# Patient Record
Sex: Female | Born: 1990 | Race: White | Hispanic: No | Marital: Married | Smoking: Former smoker
Health system: Southern US, Community
[De-identification: ages and names within clinical notes are randomized; demographics above are authoritative.]

## PROBLEM LIST (undated history)

## (undated) DIAGNOSIS — F419 Anxiety disorder, unspecified: Secondary | ICD-10-CM

## (undated) DIAGNOSIS — R000 Tachycardia, unspecified: Secondary | ICD-10-CM

## (undated) HISTORY — DX: Tachycardia, unspecified: R00.0

## (undated) HISTORY — DX: Anxiety disorder, unspecified: F41.9

---

## 2014-12-04 ENCOUNTER — Telehealth: Payer: Self-pay | Admitting: Family Medicine

## 2014-12-05 NOTE — Telephone Encounter (Signed)
[place] and would like to establish care. She needs a refill on her birth control and needs a pap smear. She has never had a pap smear. She has no medical conditions and doesn't take any other medications.  Appt scheduled for pap on 12/21/14. She will need to start a new pack of OCPs within the next week. She is taking Orsythia. Is it possible to call in one pack of OCPs to last until she is seen?

## 2014-12-05 NOTE — Telephone Encounter (Signed)
Yes. That will be ok  Cyniah Gossard A. Chauncey ReadingGann PA-C

## 2014-12-06 ENCOUNTER — Other Ambulatory Visit: Payer: Self-pay | Admitting: *Deleted

## 2014-12-06 MED ORDER — LEVONORGESTREL-ETHINYL ESTRAD 0.1-20 MG-MCG PO TABS: 1.0000 | ORAL_TABLET | Freq: Every day | ORAL | Status: DC

## 2014-12-06 NOTE — Telephone Encounter (Signed)
Refill of OCP sent in to [facility]

## 2014-12-06 NOTE — Telephone Encounter (Signed)
Script for [facility] sent to [facility] and message left for patient.

## 2014-12-21 ENCOUNTER — Encounter: Payer: Self-pay | Admitting: Physician Assistant

## 2014-12-31 ENCOUNTER — Other Ambulatory Visit: Payer: Self-pay | Admitting: Physician Assistant

## 2015-01-02 ENCOUNTER — Ambulatory Visit (INDEPENDENT_AMBULATORY_CARE_PROVIDER_SITE_OTHER): Payer: BLUE CROSS/BLUE SHIELD | Admitting: Physician Assistant

## 2015-01-02 ENCOUNTER — Encounter (INDEPENDENT_AMBULATORY_CARE_PROVIDER_SITE_OTHER): Payer: Self-pay

## 2015-01-02 ENCOUNTER — Encounter: Payer: Self-pay | Admitting: Physician Assistant

## 2015-01-02 VITALS — BP 112/77 | HR 104 | Temp 97.8°F | Ht 61.0 in | Wt 155.0 lb

## 2015-01-02 DIAGNOSIS — R5383 Other fatigue: Secondary | ICD-10-CM

## 2015-01-02 DIAGNOSIS — R635 Abnormal weight gain: Secondary | ICD-10-CM | POA: Diagnosis not present

## 2015-01-02 DIAGNOSIS — Z308 Encounter for other contraceptive management: Secondary | ICD-10-CM | POA: Diagnosis not present

## 2015-01-02 LAB — POCT CBC
Granulocyte percent: 70.4 %G (ref 37–80)
HCT, POC: 48.3 % — AB (ref 37.7–47.9)
HEMOGLOBIN: 15.3 g/dL (ref 12.2–16.2)
Lymph, poc: 2.2 (ref 0.6–3.4)
MCH, POC: 28.5 pg (ref 27–31.2)
MCHC: 31.6 g/dL — AB (ref 31.8–35.4)
MCV: 90.2 fL (ref 80–97)
MPV: 7.3 fL (ref 0–99.8)
POC Granulocyte: 6.8 (ref 2–6.9)
POC LYMPH %: 22.5 % (ref 10–50)
Platelet Count, POC: 249 10*3/uL (ref 142–424)
RBC: 5.36 M/uL (ref 4.04–5.48)
RDW, POC: 12.6 %
WBC: 9.6 10*3/uL (ref 4.6–10.2)

## 2015-01-02 MED ORDER — LEVONORGESTREL-ETHINYL ESTRAD 0.1-20 MG-MCG PO TABS: 1.0000 | ORAL_TABLET | Freq: Every day | ORAL | Status: DC

## 2015-01-02 NOTE — Progress Notes (Signed)
   Subjective:    Patient ID: Kelli Craig, female    DOB: Apr 14, 1991, 24 y.o.   MRN: [id number]  HPI 24 y/o female presents for establishment of care. She has been seeing Dr. Hyacinth Meeker at [facility] in [place]. She has no complaints today. She states that she has had problems with her weight over the past year. She exerciseds approximately 4 days a week, approximately 1 hour. She walks about a mile 2 times weekly. She recently cut out soft drinks and fast food, drinks water. Full time student at [employer]) and work full time at [employer])  She has not had a pap in the past but is having her menstrual cycle today so she will need to reschedule.     Review of Systems  Constitutional: Negative for activity change.       20-30 pound weight gain over past year  HENT: Negative.   Eyes: Negative.   Respiratory: Negative.   Cardiovascular: Negative.   Gastrointestinal: Negative.   Endocrine: Positive for polydipsia.  Genitourinary: Negative.   Neurological: Negative.   Psychiatric/Behavioral: Negative.   All other systems reviewed and are negative.      Objective:   Physical Exam  Constitutional: She is oriented to person, place, and time. She appears well-developed and well-nourished. No distress.  Neck: No thyromegaly present.  Cardiovascular: Regular rhythm and normal heart sounds.  Exam reveals no gallop and no friction rub.   No murmur heard. Mildly tachycardic   Pulmonary/Chest: Breath sounds normal. No respiratory distress. She has no wheezes. She has no rales. She exhibits no tenderness.  Musculoskeletal: Normal range of motion. She exhibits no edema.  Neurological: She is alert and oriented to person, place, and time.  Skin: She is not diaphoretic.  Psychiatric: She has a normal mood and affect. Her behavior is normal. Judgment and thought content normal.  Nursing note and vitals reviewed.         Assessment & Plan:  1. Encounter for other  contraceptive management  - levonorgestrel-ethinyl estradiol (AVIANE,ALESSE,LESSINA) 0.1-20 MG-MCG tablet; Take 1 tablet by mouth daily.  Dispense: 1 Package; Refill: 11  2. Other fatigue  - Thyroid Panel With TSH - POCT CBC  3. Weight gain  - Thyroid Panel With TSH - POCT CBC   Continue all meds Labs pending Health Maintenance reviewed Diet and exercise encouraged RTO 1 week for pap  Tiffany A. Chauncey Reading PA-C

## 2015-01-03 ENCOUNTER — Telehealth: Payer: Self-pay | Admitting: *Deleted

## 2015-01-03 LAB — THYROID PANEL WITH TSH
Free Thyroxine Index: 2.6 (ref 1.2–4.9)
T3 Uptake Ratio: 23 % — ABNORMAL LOW (ref 24–39)
T4 TOTAL: 11.1 ug/dL (ref 4.5–12.0)
TSH: 1.06 u[IU]/mL (ref 0.450–4.500)

## 2015-01-03 NOTE — Telephone Encounter (Signed)
Pt notified of lab results Verbalizes understanding 

## 2015-01-10 ENCOUNTER — Ambulatory Visit (INDEPENDENT_AMBULATORY_CARE_PROVIDER_SITE_OTHER): Payer: BLUE CROSS/BLUE SHIELD | Admitting: Physician Assistant

## 2015-01-10 ENCOUNTER — Encounter: Payer: Self-pay | Admitting: Physician Assistant

## 2015-01-10 VITALS — BP 126/74 | HR 110 | Ht 61.0 in | Wt 152.0 lb

## 2015-01-10 DIAGNOSIS — Z124 Encounter for screening for malignant neoplasm of cervix: Secondary | ICD-10-CM | POA: Diagnosis not present

## 2015-01-12 LAB — PAP IG, CT-NG, RFX HPV ASCU
Chlamydia, Nuc. Acid Amp: NEGATIVE
Gonococcus by Nucleic Acid Amp: NEGATIVE
PAP Smear Comment: 0

## 2015-01-13 NOTE — Progress Notes (Signed)
   Subjective:    Patient ID: Kelli Craig, female    DOB: [birth year], 24 y.o.   MRN: [id number]  HPI 24 y/o female presents for pap smear because she was unable to have it at her last appointment due to menstruation.    Review of Systems  Constitutional: Negative.   HENT: Negative.   Eyes: Negative.   Respiratory: Negative.   Cardiovascular: Negative.   Gastrointestinal: Negative.   Endocrine: Negative.   Genitourinary: Negative.   Neurological: Negative.   Psychiatric/Behavioral: Negative.        Objective:   Physical Exam  Genitourinary: There is no rash, tenderness or lesion on the right labia. There is no rash, tenderness or lesion on the left labia. There is bleeding (slight bleeding at cervix) in the vagina. No erythema or tenderness in the vagina. No vaginal discharge found.  Nursing note and vitals reviewed.         Assessment & Plan:  1. Pap smear for cervical cancer screening  - Pap IG, CT/NG w/ reflex HPV when ASC-U   Pelvic exam yearly, pap repeat in 3 years if normal.   Kazim Corrales A. Chauncey Reading PA-C

## 2015-10-22 ENCOUNTER — Other Ambulatory Visit: Payer: Self-pay | Admitting: Physician Assistant

## 2015-11-27 ENCOUNTER — Encounter: Payer: Self-pay | Admitting: *Deleted

## 2015-11-27 ENCOUNTER — Ambulatory Visit (INDEPENDENT_AMBULATORY_CARE_PROVIDER_SITE_OTHER): Payer: BLUE CROSS/BLUE SHIELD | Admitting: Family

## 2015-11-27 ENCOUNTER — Telehealth: Payer: Self-pay | Admitting: Family

## 2015-11-27 ENCOUNTER — Encounter: Payer: Self-pay | Admitting: Family

## 2015-11-27 ENCOUNTER — Encounter (INDEPENDENT_AMBULATORY_CARE_PROVIDER_SITE_OTHER): Payer: Self-pay

## 2015-11-27 VITALS — BP 121/78 | HR 106 | Temp 97.9°F | Ht 61.0 in | Wt 157.6 lb

## 2015-11-27 DIAGNOSIS — S39012A Strain of muscle, fascia and tendon of lower back, initial encounter: Secondary | ICD-10-CM

## 2015-11-27 MED ORDER — NAPROXEN 125 MG/5ML PO SUSP: 500.0000 mg | Freq: Two times a day (BID) | ORAL | Status: DC

## 2015-11-27 MED ORDER — CYCLOBENZAPRINE HCL 5 MG PO TABS: 5.0000 mg | ORAL_TABLET | Freq: Three times a day (TID) | ORAL | Status: DC | PRN

## 2015-11-27 MED ORDER — PREDNISONE 10 MG (21) PO TBPK: 10.0000 mg | ORAL_TABLET | Freq: Every day | ORAL | Status: DC

## 2015-11-27 MED ORDER — NAPROXEN 500 MG PO TABS: 500.0000 mg | ORAL_TABLET | Freq: Two times a day (BID) | ORAL | Status: DC

## 2015-11-27 NOTE — Telephone Encounter (Signed)
Prescription sent to pharmacy.

## 2015-11-27 NOTE — Patient Instructions (Signed)

## 2015-11-27 NOTE — Progress Notes (Signed)
   Subjective:    Patient ID: Kelli Craig, female    DOB: [birth year], 25 y.o.   MRN: [id number]  Back Pain This is a new problem. The current episode started in the past 7 days. The problem occurs constantly. The problem is unchanged. The pain is present in the lumbar spine. The quality of the pain is described as aching. The pain does not radiate. The pain is at a severity of 7/10. The pain is moderate. The symptoms are aggravated by bending and twisting. Pertinent negatives include no bladder incontinence, bowel incontinence, dysuria, fever, headaches, leg pain, numbness, tingling or weakness. She has tried NSAIDs for the symptoms. The treatment provided mild relief.      Review of Systems  Constitutional: Negative.  Negative for fever.  HENT: Negative.   Eyes: Negative.   Respiratory: Negative.  Negative for shortness of breath.   Cardiovascular: Negative.  Negative for palpitations.  Gastrointestinal: Negative.  Negative for bowel incontinence.  Endocrine: Negative.   Genitourinary: Negative.  Negative for bladder incontinence and dysuria.  Musculoskeletal: Positive for back pain.  Neurological: Negative.  Negative for tingling, weakness, numbness and headaches.  Hematological: Negative.   Psychiatric/Behavioral: Negative.   All other systems reviewed and are negative.      Objective:   Physical Exam  Constitutional: She is oriented to person, place, and time. She appears well-developed and well-nourished. No distress.  HENT:  Head: Normocephalic and atraumatic.  Eyes: Pupils are equal, round, and reactive to light.  Neck: Normal range of motion. Neck supple. No thyromegaly present.  Cardiovascular: Normal rate, regular rhythm, normal heart sounds and intact distal pulses.   No murmur heard. Pulmonary/Chest: Effort normal and breath sounds normal. No respiratory distress. She has no wheezes.  Abdominal: Soft. Bowel sounds are normal. She exhibits no distension. There is no  tenderness.  Musculoskeletal: Normal range of motion. She exhibits no edema or tenderness.  Neurological: She is alert and oriented to person, place, and time. She has normal reflexes. No cranial nerve deficit.  Skin: Skin is warm and dry.  Psychiatric: She has a normal mood and affect. Her behavior is normal. Judgment and thought content normal.  Vitals reviewed.   BP 121/78 mmHg  Pulse 106  Temp(Src) 97.9 F (36.6 C) (Oral)  Ht 5\' 1"  (1.549 m)  Wt 157 lb 9.6 oz (71.487 kg)  BMI 29.79 kg/m2       Assessment & Plan:  1. Low back strain, initial encounter -Rest -Ice and heat as needed -ROM exercises discussed -Naprosyn with food -Sedation precautions discussed -RTO prn  - naproxen (NAPROSYN) 125 MG/5ML suspension; Take 20 mLs (500 mg total) by mouth 2 (two) times daily with a meal.  Dispense: 473 mL; Refill: 3 - cyclobenzaprine (FLEXERIL) 5 MG tablet; Take 1 tablet (5 mg total) by mouth 3 (three) times daily as needed for muscle spasms.  Dispense: 30 tablet; Refill: 0 - predniSONE (STERAPRED UNI-PAK 21 TAB) 10 MG (21) TBPK tablet; Take 1 tablet (10 mg total) by mouth daily. As directed x 6 days  Dispense: 21 tablet; Refill: 0  Jannifer Rodneyhristy Aliyana Dlugosz, FNP

## 2016-01-14 ENCOUNTER — Other Ambulatory Visit: Payer: Self-pay | Admitting: Pediatrics

## 2016-02-14 ENCOUNTER — Other Ambulatory Visit: Payer: Self-pay | Admitting: Pediatrics

## 2016-03-15 ENCOUNTER — Other Ambulatory Visit: Payer: Self-pay | Admitting: Pediatrics

## 2016-04-21 ENCOUNTER — Other Ambulatory Visit: Payer: Self-pay | Admitting: *Deleted

## 2016-04-21 MED ORDER — LEVONORGESTREL-ETHINYL ESTRAD 0.1-20 MG-MCG PO TABS: 1.0000 | ORAL_TABLET | Freq: Every day | ORAL | 1 refills | Status: DC

## 2016-06-16 ENCOUNTER — Ambulatory Visit (INDEPENDENT_AMBULATORY_CARE_PROVIDER_SITE_OTHER): Payer: BLUE CROSS/BLUE SHIELD | Admitting: Family

## 2016-06-16 ENCOUNTER — Encounter: Payer: Self-pay | Admitting: Family

## 2016-06-16 VITALS — BP 122/77 | HR 94 | Temp 98.3°F | Ht 61.0 in | Wt 165.2 lb

## 2016-06-16 DIAGNOSIS — R5383 Other fatigue: Secondary | ICD-10-CM

## 2016-06-16 DIAGNOSIS — R635 Abnormal weight gain: Secondary | ICD-10-CM

## 2016-06-16 DIAGNOSIS — R42 Dizziness and giddiness: Secondary | ICD-10-CM | POA: Diagnosis not present

## 2016-06-16 NOTE — Patient Instructions (Signed)
Fatigue Introduction Fatigue is feeling tired all of the time, a lack of energy, or a lack of motivation. Occasional or mild fatigue is often a normal response to activity or life in general. However, long-lasting (chronic) or extreme fatigue may indicate an underlying medical condition. Follow these instructions at home: Watch your fatigue for any changes. The following actions may help to lessen any discomfort you are feeling:  Talk to your health care provider about how much sleep you need each night. Try to get the required amount every night.  Take medicines only as directed by your health care provider.  Eat a healthy and nutritious diet. Ask your health care provider if you need help changing your diet.  Drink enough fluid to keep your urine clear or pale yellow.  Practice ways of relaxing, such as yoga, meditation, massage therapy, or acupuncture.  Exercise regularly.  Change situations that cause you stress. Try to keep your work and personal routine reasonable.  Do not abuse illegal drugs.  Limit alcohol intake to no more than 1 drink per day for nonpregnant women and 2 drinks per day for men. One drink equals 12 ounces of beer, 5 ounces of wine, or 1 ounces of hard liquor.  Take a multivitamin, if directed by your health care provider. Contact a health care provider if:  Your fatigue does not get better.  You have a fever.  You have unintentional weight loss or gain.  You have headaches.  You have difficulty:  Falling asleep.  Sleeping throughout the night.  You feel angry, guilty, anxious, or sad.  You are unable to have a bowel movement (constipation).  You skin is dry.  Your legs or another part of your body is swollen. Get help right away if:  You feel confused.  Your vision is blurry.  You feel faint or pass out.  You have a severe headache.  You have severe abdominal, pelvic, or back pain.  You have chest pain, shortness of breath, or an  irregular or fast heartbeat.  You are unable to urinate or you urinate less than normal.  You develop abnormal bleeding, such as bleeding from the rectum, vagina, nose, lungs, or nipples.  You vomit blood.  You have thoughts about harming yourself or committing suicide.  You are worried that you might harm someone else. This information is not intended to replace advice given to you by your health care provider. Make sure you discuss any questions you have with your health care provider. Document Released: 05/04/2007 Document Revised: 12/13/2015 Document Reviewed: 11/08/2013  2017 Elsevier  

## 2016-06-16 NOTE — Progress Notes (Signed)
   Subjective:    Patient ID: Kelli Craig, female    DOB: [birth year], 25 y.o.   MRN: [id number]  HPI PT presents to the office today with fatigue, dizziness, and hard to lose weight over the  Last few months. Pt states she has had to pull off the road because she felt "weird and dizziness". PT states she is starting to a new job in January and is stressed, but does not believe this is the cause of her symptoms. PT last menses 06/15/16 and denies heavy bleeding. Pt states she has gained 30 lbs over the last few years. PT does state that her diet is not the best and does not exercise like she should but it is hard for her to lose the weight.    Review of Systems  Constitutional: Positive for fatigue.  Neurological: Positive for dizziness and light-headedness.  All other systems reviewed and are negative.      Objective:   Physical Exam  Constitutional: She is oriented to person, place, and time. She appears well-developed and well-nourished. No distress.  HENT:  Head: Normocephalic and atraumatic.  Eyes: Pupils are equal, round, and reactive to light.  Neck: Normal range of motion. Neck supple. No thyromegaly present.  Cardiovascular: Normal rate, regular rhythm, normal heart sounds and intact distal pulses.   No murmur heard. Pulmonary/Chest: Effort normal and breath sounds normal. No respiratory distress. She has no wheezes.  Abdominal: Soft. Bowel sounds are normal. She exhibits no distension. There is no tenderness.  Musculoskeletal: Normal range of motion. She exhibits no edema or tenderness.  Neurological: She is alert and oriented to person, place, and time.  Skin: Skin is warm and dry.  Psychiatric: She has a normal mood and affect. Her behavior is normal. Judgment and thought content normal.  Vitals reviewed.     BP 122/77 (BP Location: Left Arm, Patient Position: Sitting, Cuff Size: Normal)   Pulse 94   Temp 98.3 F (36.8 C) (Oral)   Ht '5\' 1"'$  (1.549 m)   Wt 165 lb 3.2  oz (74.9 kg)   LMP 06/12/2016 (Approximate)   BMI 31.21 kg/m      Assessment & Plan:  1. Other fatigue - CMP14+EGFR - Thyroid Panel With TSH - VITAMIN D 25 Hydroxy (Vit-D Deficiency, Fractures) - Anemia Profile B  2. Dizziness - CMP14+EGFR - Thyroid Panel With TSH - VITAMIN D 25 Hydroxy (Vit-D Deficiency, Fractures) - Anemia Profile B  3. Weight gain - CMP14+EGFR - Thyroid Panel With TSH - VITAMIN D 25 Hydroxy (Vit-D Deficiency, Fractures) - Anemia Profile B  Force fluids Rest Labs pending Encouraged healthy diet and exercise RTO Prn   Evelina Dun, FNP

## 2016-06-18 LAB — ANEMIA PROFILE B
BASOS ABS: 0 10*3/uL (ref 0.0–0.2)
Basos: 0 %
EOS (ABSOLUTE): 0.2 10*3/uL (ref 0.0–0.4)
Eos: 2 %
Ferritin: 130 ng/mL (ref 15–150)
Folate: >20 ng/mL (ref >3.0)
Hematocrit: 45.3 % (ref 34.0–46.6)
Hemoglobin: 15.2 g/dL (ref 11.1–15.9)
IMMATURE GRANS (ABS): 0 10*3/uL (ref 0.0–0.1)
IMMATURE GRANULOCYTES: 0 %
IRON: 73 ug/dL (ref 27–159)
Iron Saturation: 17 % (ref 15–55)
LYMPHS: 26 %
Lymphocytes Absolute: 2.9 10*3/uL (ref 0.7–3.1)
MCH: 30.6 pg (ref 26.6–33.0)
MCHC: 33.6 g/dL (ref 31.5–35.7)
MCV: 91 fL (ref 79–97)
MONOCYTES: 5 %
Monocytes Absolute: 0.6 10*3/uL (ref 0.1–0.9)
NEUTROS ABS: 7.4 10*3/uL — AB (ref 1.4–7.0)
Neutrophils: 67 %
PLATELETS: 255 10*3/uL (ref 150–379)
RBC: 4.97 x10E6/uL (ref 3.77–5.28)
RDW: 13.1 % (ref 12.3–15.4)
RETIC CT PCT: 0.8 % (ref 0.6–2.6)
TIBC: 425 ug/dL (ref 250–450)
UIBC: 352 ug/dL (ref 131–425)
Vitamin B-12: 390 pg/mL (ref 211–946)
WBC: 11.1 10*3/uL — ABNORMAL HIGH (ref 3.4–10.8)

## 2016-06-18 LAB — CMP14+EGFR
ALBUMIN: 4.5 g/dL (ref 3.5–5.5)
ALK PHOS: 89 IU/L (ref 39–117)
ALT: 20 IU/L (ref 0–32)
AST: 16 IU/L (ref 0–40)
Albumin/Globulin Ratio: 1.5 (ref 1.2–2.2)
BUN / CREAT RATIO: 11 (ref 9–23)
BUN: 8 mg/dL (ref 6–20)
Bilirubin Total: 0.3 mg/dL (ref 0.0–1.2)
CALCIUM: 9.2 mg/dL (ref 8.7–10.2)
CO2: 20 mmol/L (ref 18–29)
CREATININE: 0.75 mg/dL (ref 0.57–1.00)
Chloride: 101 mmol/L (ref 96–106)
GFR calc Af Amer: 128 mL/min/{1.73_m2} (ref >59)
GFR, EST NON AFRICAN AMERICAN: 111 mL/min/{1.73_m2} (ref >59)
GLOBULIN, TOTAL: 3 g/dL (ref 1.5–4.5)
GLUCOSE: 80 mg/dL (ref 65–99)
Potassium: 5.1 mmol/L (ref 3.5–5.2)
SODIUM: 141 mmol/L (ref 134–144)
Total Protein: 7.5 g/dL (ref 6.0–8.5)

## 2016-06-18 LAB — THYROID PANEL WITH TSH
FREE THYROXINE INDEX: 2.5 (ref 1.2–4.9)
T3 Uptake Ratio: 24 % (ref 24–39)
T4 TOTAL: 10.4 ug/dL (ref 4.5–12.0)
TSH: 1.53 u[IU]/mL (ref 0.450–4.500)

## 2016-06-18 LAB — VITAMIN D 25 HYDROXY (VIT D DEFICIENCY, FRACTURES): VIT D 25 HYDROXY: 35.7 ng/mL (ref 30.0–100.0)

## 2016-06-23 ENCOUNTER — Encounter: Payer: Self-pay | Admitting: Family

## 2016-07-08 ENCOUNTER — Encounter: Payer: Self-pay | Admitting: Family

## 2016-07-08 ENCOUNTER — Ambulatory Visit (INDEPENDENT_AMBULATORY_CARE_PROVIDER_SITE_OTHER): Payer: BLUE CROSS/BLUE SHIELD | Admitting: Family

## 2016-07-08 VITALS — BP 127/76 | HR 95 | Temp 98.2°F | Ht 61.0 in | Wt 166.8 lb

## 2016-07-08 DIAGNOSIS — F411 Generalized anxiety disorder: Secondary | ICD-10-CM | POA: Diagnosis not present

## 2016-07-08 LAB — CBC WITH DIFFERENTIAL/PLATELET
BASOS ABS: 0 10*3/uL (ref 0.0–0.2)
Basos: 0 %
EOS (ABSOLUTE): 0.2 10*3/uL (ref 0.0–0.4)
Eos: 3 %
Hematocrit: 44.4 % (ref 34.0–46.6)
Hemoglobin: 14.5 g/dL (ref 11.1–15.9)
Immature Grans (Abs): 0 10*3/uL (ref 0.0–0.1)
Immature Granulocytes: 0 %
LYMPHS ABS: 2.2 10*3/uL (ref 0.7–3.1)
Lymphs: 27 %
MCH: 30.3 pg (ref 26.6–33.0)
MCHC: 32.7 g/dL (ref 31.5–35.7)
MCV: 93 fL (ref 79–97)
MONOCYTES: 6 %
MONOS ABS: 0.5 10*3/uL (ref 0.1–0.9)
Neutrophils Absolute: 5.1 10*3/uL (ref 1.4–7.0)
Neutrophils: 64 %
PLATELETS: 251 10*3/uL (ref 150–379)
RBC: 4.79 x10E6/uL (ref 3.77–5.28)
RDW: 13.3 % (ref 12.3–15.4)
WBC: 8 10*3/uL (ref 3.4–10.8)

## 2016-07-08 MED ORDER — ESCITALOPRAM OXALATE 10 MG PO TABS: 10.0000 mg | ORAL_TABLET | Freq: Every day | ORAL | 3 refills | Status: DC

## 2016-07-08 NOTE — Patient Instructions (Signed)
Stress and Stress Management Stress is a normal reaction to life events. It is what you feel when life demands more than you are used to or more than you can handle. Some stress can be useful. For example, the stress reaction can help you catch the last bus of the day, study for a test, or meet a deadline at work. But stress that occurs too often or for too long can cause problems. It can affect your emotional health and interfere with relationships and normal daily activities. Too much stress can weaken your immune system and increase your risk for physical illness. If you already have a medical problem, stress can make it worse. What are the causes? All sorts of life events may cause stress. An event that causes stress for one person may not be stressful for another person. Major life events commonly cause stress. These may be positive or negative. Examples include losing your job, moving into a new home, getting married, having a baby, or losing a loved one. Less obvious life events may also cause stress, especially if they occur day after day or in combination. Examples include working long hours, driving in traffic, caring for children, being in debt, or being in a difficult relationship. What are the signs or symptoms? Stress may cause emotional symptoms including, the following:  Anxiety. This is feeling worried, afraid, on edge, overwhelmed, or out of control.  Anger. This is feeling irritated or impatient.  Depression. This is feeling sad, down, helpless, or guilty.  Difficulty focusing, remembering, or making decisions. Stress may cause physical symptoms, including the following:  Aches and pains. These may affect your head, neck, back, stomach, or other areas of your body.  Tight muscles or clenched jaw.  Low energy or trouble sleeping. Stress may cause unhealthy behaviors, including the following:  Eating to feel better (overeating) or skipping meals.  Sleeping too little, too  much, or both.  Working too much or putting off tasks (procrastination).  Smoking, drinking alcohol, or using drugs to feel better. How is this diagnosed? Stress is diagnosed through an assessment by your health care provider. Your health care provider will ask questions about your symptoms and any stressful life events.Your health care provider will also ask about your medical history and may order blood tests or other tests. Certain medical conditions and medicine can cause physical symptoms similar to stress. Mental illness can cause emotional symptoms and unhealthy behaviors similar to stress. Your health care provider may refer you to a mental health professional for further evaluation. How is this treated? Stress management is the recommended treatment for stress.The goals of stress management are reducing stressful life events and coping with stress in healthy ways. Techniques for reducing stressful life events include the following:  Stress identification. Self-monitor for stress and identify what causes stress for you. These skills may help you to avoid some stressful events.  Time management. Set your priorities, keep a calendar of events, and learn to say "no." These tools can help you avoid making too many commitments. Techniques for coping with stress include the following:  Rethinking the problem. Try to think realistically about stressful events rather than ignoring them or overreacting. Try to find the positives in a stressful situation rather than focusing on the negatives.  Exercise. Physical exercise can release both physical and emotional tension. The key is to find a form of exercise you enjoy and do it regularly.  Relaxation techniques. These relax the body and mind. Examples include yoga,  meditation, tai chi, biofeedback, deep breathing, progressive muscle relaxation, listening to music, being out in nature, journaling, and other hobbies. Again, the key is to find one or  more that you enjoy and can do regularly.  Healthy lifestyle. Eat a balanced diet, get plenty of sleep, and do not smoke. Avoid using alcohol or drugs to relax.  Strong support network. Spend time with family, friends, or other people you enjoy being around.Express your feelings and talk things over with someone you trust. Counseling or talktherapy with a mental health professional may be helpful if you are having difficulty managing stress on your own. Medicine is typically not recommended for the treatment of stress.Talk to your health care provider if you think you need medicine for symptoms of stress. Follow these instructions at home:  Keep all follow-up visits as directed by your health care provider.  Take all medicines as directed by your health care provider. Contact a health care provider if:  Your symptoms get worse or you start having new symptoms.  You feel overwhelmed by your problems and can no longer manage them on your own. Get help right away if:  You feel like hurting yourself or someone else. This information is not intended to replace advice given to you by your health care provider. Make sure you discuss any questions you have with your health care provider. Document Released: 12/31/2000 Document Revised: 12/13/2015 Document Reviewed: 03/01/2013 Elsevier Interactive Patient Education  2017 [employer].

## 2016-07-08 NOTE — Progress Notes (Signed)
   Subjective:    Patient ID: Kelli Craig, female    DOB: 25-Oct-1990, 25 y.o.   MRN: [id number]  HPI Pt presents to the office today with recurrent fatigue and dizziness. PT states this started two months ago and seems to have become worse. Pt was seen on 06/16/16 and had normal CMP, Thyroid panel, Anemia, and Vit D. Pt states these episodes have increased and feels this is related to stress. Pt is getting married in 3 months and has a very stressful job.    Review of Systems  Constitutional: Positive for fatigue.  Neurological: Positive for dizziness.  Psychiatric/Behavioral: Positive for decreased concentration.  All other systems reviewed and are negative.      Objective:   Physical Exam  Constitutional: She is oriented to person, place, and time. She appears well-developed and well-nourished. No distress.  HENT:  Head: Normocephalic.  Eyes: Pupils are equal, round, and reactive to light.  Neck: Normal range of motion. Neck supple. No thyromegaly present.  Cardiovascular: Normal rate, regular rhythm, normal heart sounds and intact distal pulses.   No murmur heard. Pulmonary/Chest: Effort normal and breath sounds normal. No respiratory distress. She has no wheezes.  Abdominal: Soft. Bowel sounds are normal. She exhibits no distension. There is no tenderness.  Musculoskeletal: Normal range of motion. She exhibits no edema or tenderness.  Neurological: She is alert and oriented to person, place, and time.  Skin: Skin is warm and dry.  Psychiatric: She has a normal mood and affect. Her behavior is normal. Judgment and thought content normal.  Vitals reviewed.   BP 127/76   Pulse 95   Temp 98.2 F (36.8 C) (Oral)   Ht 5\' 1"  (1.549 m)   Wt 166 lb 12.8 oz (75.7 kg)   LMP 06/12/2016 (Approximate)   BMI 31.52 kg/m      Assessment & Plan:  1. GAD (generalized anxiety disorder) -Pt started on Lexapro 10mg  today -Stress management  discussed -RTO in 5 weeks  - escitalopram  (LEXAPRO) 10 MG tablet; Take 1 tablet (10 mg total) by mouth daily.  Dispense: 90 tablet; Refill: 3 - CBC with Differential/Platelet  Jannifer Rodneyhristy Garhett Bernhard, FNP

## 2016-12-03 ENCOUNTER — Other Ambulatory Visit: Payer: Self-pay | Admitting: Family

## 2016-12-03 NOTE — Telephone Encounter (Signed)
Forwarding to Christy.

## 2017-05-28 ENCOUNTER — Other Ambulatory Visit: Payer: Self-pay | Admitting: Family

## 2017-05-28 NOTE — Telephone Encounter (Signed)
Last seen 07/08/16

## 2017-08-26 ENCOUNTER — Other Ambulatory Visit: Payer: Self-pay | Admitting: Family

## 2018-01-25 ENCOUNTER — Other Ambulatory Visit: Payer: Self-pay | Admitting: Physician Assistant

## 2018-01-25 DIAGNOSIS — R4789 Other speech disturbances: Secondary | ICD-10-CM

## 2018-01-25 DIAGNOSIS — R42 Dizziness and giddiness: Secondary | ICD-10-CM

## 2018-01-25 DIAGNOSIS — H539 Unspecified visual disturbance: Secondary | ICD-10-CM

## 2018-01-27 ENCOUNTER — Ambulatory Visit
Admission: RE | Admit: 2018-01-27 | Discharge: 2018-01-27 | Disposition: A | Discharge status: Home / Self Care | Payer: BLUE CROSS/BLUE SHIELD | Source: Ambulatory Visit | Attending: Physician Assistant | Admitting: Physician Assistant

## 2018-01-27 DIAGNOSIS — H539 Unspecified visual disturbance: Secondary | ICD-10-CM | POA: Insufficient documentation

## 2018-01-27 DIAGNOSIS — R4789 Other speech disturbances: Secondary | ICD-10-CM | POA: Diagnosis present

## 2018-01-27 DIAGNOSIS — R42 Dizziness and giddiness: Secondary | ICD-10-CM | POA: Diagnosis present

## 2019-06-24 ENCOUNTER — Other Ambulatory Visit: Payer: Self-pay

## 2019-06-24 ENCOUNTER — Emergency Department
Admission: EM | Admit: 2019-06-24 | Discharge: 2019-06-24 | Disposition: A | Discharge status: Home / Self Care | Payer: BC Managed Care – PPO | Attending: Emergency Medicine | Admitting: Emergency Medicine

## 2019-06-24 DIAGNOSIS — Z79899 Other long term (current) drug therapy: Secondary | ICD-10-CM | POA: Insufficient documentation

## 2019-06-24 DIAGNOSIS — R42 Dizziness and giddiness: Secondary | ICD-10-CM | POA: Insufficient documentation

## 2019-06-24 DIAGNOSIS — R079 Chest pain, unspecified: Secondary | ICD-10-CM | POA: Diagnosis not present

## 2019-06-24 DIAGNOSIS — F17200 Nicotine dependence, unspecified, uncomplicated: Secondary | ICD-10-CM | POA: Insufficient documentation

## 2019-06-24 DIAGNOSIS — R55 Syncope and collapse: Secondary | ICD-10-CM | POA: Diagnosis present

## 2019-06-24 LAB — CBC
HCT: 46.3 % — ABNORMAL HIGH (ref 36.0–46.0)
Hemoglobin: 15.6 g/dL — ABNORMAL HIGH (ref 12.0–15.0)
MCH: 29.8 pg (ref 26.0–34.0)
MCHC: 33.7 g/dL (ref 30.0–36.0)
MCV: 88.5 fL (ref 80.0–100.0)
Platelets: 250 10*3/uL (ref 150–400)
RBC: 5.23 MIL/uL — ABNORMAL HIGH (ref 3.87–5.11)
RDW: 13 % (ref 11.5–15.5)
WBC: 10.7 10*3/uL — ABNORMAL HIGH (ref 4.0–10.5)
nRBC: 0 % (ref 0.0–0.2)

## 2019-06-24 LAB — URINALYSIS, COMPLETE (UACMP) WITH MICROSCOPIC
Bilirubin Urine: NEGATIVE
Glucose, UA: NEGATIVE mg/dL
Hgb urine dipstick: NEGATIVE
Ketones, ur: NEGATIVE mg/dL
Leukocytes,Ua: NEGATIVE
Nitrite: NEGATIVE
Protein, ur: NEGATIVE mg/dL
Specific Gravity, Urine: 1.005 (ref 1.005–1.030)
pH: 6 (ref 5.0–8.0)

## 2019-06-24 LAB — BASIC METABOLIC PANEL
Anion gap: 11 (ref 5–15)
BUN: 15 mg/dL (ref 6–20)
CO2: 20 mmol/L — ABNORMAL LOW (ref 22–32)
Calcium: 9 mg/dL (ref 8.9–10.3)
Chloride: 108 mmol/L (ref 98–111)
Creatinine, Ser: 0.83 mg/dL (ref 0.44–1.00)
GFR calc Af Amer: >60 mL/min (ref >60)
GFR calc non Af Amer: >60 mL/min (ref >60)
Glucose, Bld: 113 mg/dL — ABNORMAL HIGH (ref 70–99)
Potassium: 3.3 mmol/L — ABNORMAL LOW (ref 3.5–5.1)
Sodium: 139 mmol/L (ref 135–145)

## 2019-06-24 LAB — PREGNANCY, URINE: Preg Test, Ur: NEGATIVE

## 2019-06-24 LAB — FIBRIN DERIVATIVES D-DIMER (ARMC ONLY): Fibrin derivatives D-dimer (ARMC): 215.6 ng/mL (FEU) (ref 0.00–499.00)

## 2019-06-24 MED ORDER — SODIUM CHLORIDE 0.9% FLUSH: 3.0000 mL | Freq: Once | INTRAVENOUS | Status: DC

## 2019-06-24 MED ORDER — LACTATED RINGERS IV BOLUS
1000.0000 mL | Freq: Once | INTRAVENOUS | Status: AC
  Administered 2019-06-24: 1000 mL via INTRAVENOUS

## 2019-06-24 NOTE — ED Notes (Signed)
See triage note  Presents s/p "syncopal episode" while work

## 2019-06-24 NOTE — ED Provider Notes (Signed)
[facility] Emergency Department Provider Note   ____________________________________________   First MD Initiated Contact with Patient 06/24/19 1115     (approximate)  I have reviewed the triage vital signs and the nursing notes.   HISTORY  Chief Complaint Loss of Consciousness    HPI Kelli Craig is a 28 y.o. female with no significant past medical history presents to the ED complaining of syncope.  Patient reports that she has dealt with intermittent lightheadedness for a long time, previously evaluated by neurology and told she might need tilt table testing.  She states that she had never actually passed out until today.  She states she was sitting in a chair at work when she began to feel lightheaded and lost consciousness for a few seconds.  She states her vision went blurry for a few seconds, but now she feels much better and no longer feels lightheaded.  She is concerned because she has been dealing with chest pain recently, which is new for her.  She describes it as a dull ache in the center of her chest present constantly for about the past 2 weeks.  She has not noticed any pain or swelling in her legs, denies any recent surgery or history of DVT/PE.  She does currently take OCPs.        History reviewed. No pertinent past medical history.  There are no active problems to display for this patient.   History reviewed. No pertinent surgical history.  Prior to Admission medications   Medication Sig Start Date End Date Taking? Authorizing Provider  escitalopram (LEXAPRO) 10 MG tablet Take 1 tablet (10 mg total) by mouth daily. 07/08/16   Junie Spencer, FNP  PRESCRIPTION MEDICATION Birth Control    [provider]  SRONYX 0.1-20 MG-MCG tablet TAKE 1 TABLET BY MOUTH DAILY. 05/28/17   Junie Spencer, FNP    Allergies Patient has no known allergies.  Family History  Problem Relation Age of Onset  . COPD Mother   . Cancer Maternal  Aunt 49  . Cancer Paternal Aunt 40  . Hearing loss Paternal Grandmother 22    Social History Social History   Tobacco Use  . Smoking status: Light Tobacco Smoker  . Smokeless tobacco: Never Used  . Tobacco comment: pt states she only smokes when she drinks alcohol  Substance Use Topics  . Alcohol use: Yes    Alcohol/week: 0.0 standard drinks    Comment: maybe once a month per pt  . Drug use: No    Review of Systems  Constitutional: No fever/chills Eyes: No visual changes. ENT: No sore throat. Cardiovascular: Positive for chest pain.  Positive for syncope. Respiratory: Denies shortness of breath. Gastrointestinal: No abdominal pain.  No nausea, no vomiting.  No diarrhea.  No constipation. Genitourinary: Negative for dysuria. Musculoskeletal: Negative for back pain. Skin: Negative for rash. Neurological: Negative for headaches, focal weakness or numbness.  ____________________________________________   PHYSICAL EXAM:  VITAL SIGNS: ED Triage Vitals  Enc Vitals Group     BP 06/24/19 0914 (!) 133/99     Pulse Rate 06/24/19 0914 (!) 110     Resp 06/24/19 0914 18     Temp 06/24/19 0914 99 F (37.2 C)     Temp Source 06/24/19 0914 Oral     SpO2 06/24/19 0914 100 %     Weight 06/24/19 0910 180 lb (81.6 kg)     Height 06/24/19 0910 5\' 1"  (1.549 m)  Head Circumference --      Peak Flow --      Pain Score 06/24/19 0910 0     Pain Loc --      Pain Edu? --      Excl. in [place]? --     Constitutional: Alert and oriented. Eyes: Conjunctivae are normal. Head: Atraumatic. Nose: No congestion/rhinnorhea. Mouth/Throat: Mucous membranes are moist. Neck: Normal ROM Cardiovascular: Tachycardic, regular rhythm. Grossly normal heart sounds. Respiratory: Normal respiratory effort.  No retractions. Lungs CTAB. Gastrointestinal: Soft and nontender. No distention. Genitourinary: deferred Musculoskeletal: No lower extremity tenderness nor edema. Neurologic:  Normal speech and  language. No gross focal neurologic deficits are appreciated. Skin:  Skin is warm, dry and intact. No rash noted. Psychiatric: Mood and affect are normal. Speech and behavior are normal.  ____________________________________________   LABS (all labs ordered are listed, but only abnormal results are displayed)  Labs Reviewed  BASIC METABOLIC PANEL - Abnormal; Notable for the following components:      Result Value   Potassium 3.3 (*)    CO2 20 (*)    Glucose, Bld 113 (*)    All other components within normal limits  CBC - Abnormal; Notable for the following components:   WBC 10.7 (*)    RBC 5.23 (*)    Hemoglobin 15.6 (*)    HCT 46.3 (*)    All other components within normal limits  URINALYSIS, COMPLETE (UACMP) WITH MICROSCOPIC - Abnormal; Notable for the following components:   Color, Urine STRAW (*)    APPearance CLEAR (*)    Bacteria, UA MANY (*)    All other components within normal limits  PREGNANCY, URINE  FIBRIN DERIVATIVES D-DIMER ([facility] ONLY)  POC URINE PREG, ED  CBG MONITORING, ED   ____________________________________________  EKG  ED ECG REPORT I, Blake Divine, the attending physician, personally viewed and interpreted this ECG.   Date: 06/24/2019  EKG Time: 9:27  Rate: 106  Rhythm: sinus tachycardia  Axis: Normal  Intervals:none  ST&T Change: None   PROCEDURES  Procedure(s) performed (including Critical Care):  Procedures   ____________________________________________   INITIAL IMPRESSION / ASSESSMENT AND PLAN / ED COURSE       28 year old female with history of recurrent dizziness presents to the ED complaining of episode of syncope earlier today as well as constant pain in her chest for the past 2 weeks.  EKG shows no evidence of arrhythmia or ischemia, does show sinus tachycardia.  Given tachycardia along with chest pain, will need to assess for PE and unable to rule this out via [facility] criteria as patient takes OCPs.  She is low risk by  [place], will check D-dimer and if this is negative, doubt PE.  Pregnancy testing is pending, however UA is unremarkable.  No evidence of infectious process.  Pregnancy testing is negative, D-dimer within normal limits.  Doubt PE and patient remains asymptomatic at this time.  Counseled patient to establish care with PCP and to return to the ED for new or worsening symptoms.  Patient agrees with plan.      ____________________________________________   FINAL CLINICAL IMPRESSION(S) / ED DIAGNOSES  Final diagnoses:  Syncope, unspecified syncope type  Lightheadedness  Nonspecific chest pain     ED Discharge Orders    None       Note:  This document was prepared using Dragon voice recognition software and may include unintentional dictation errors.   Blake Divine, MD 06/24/19 1534

## 2019-06-24 NOTE — ED Triage Notes (Signed)
Pt states she passed out while sitting at work, states she has been having some chest pain this week. States she is very stressed at work. Pt is a/ox4 on arrival. In NAD

## 2020-07-16 ENCOUNTER — Emergency Department (HOSPITAL_COMMUNITY): Payer: No Typology Code available for payment source

## 2020-07-16 ENCOUNTER — Encounter (HOSPITAL_COMMUNITY): Payer: Self-pay

## 2020-07-16 ENCOUNTER — Emergency Department (HOSPITAL_COMMUNITY)
Admission: EM | Admit: 2020-07-16 | Discharge: 2020-07-16 | Disposition: A | Discharge status: Home / Self Care | Payer: No Typology Code available for payment source | Attending: Emergency Medicine | Admitting: Emergency Medicine

## 2020-07-16 ENCOUNTER — Other Ambulatory Visit: Payer: Self-pay

## 2020-07-16 DIAGNOSIS — F17200 Nicotine dependence, unspecified, uncomplicated: Secondary | ICD-10-CM | POA: Insufficient documentation

## 2020-07-16 DIAGNOSIS — Z3A11 11 weeks gestation of pregnancy: Secondary | ICD-10-CM | POA: Insufficient documentation

## 2020-07-16 DIAGNOSIS — H538 Other visual disturbances: Secondary | ICD-10-CM

## 2020-07-16 DIAGNOSIS — O26892 Other specified pregnancy related conditions, second trimester: Secondary | ICD-10-CM | POA: Insufficient documentation

## 2020-07-16 DIAGNOSIS — O99891 Other specified diseases and conditions complicating pregnancy: Secondary | ICD-10-CM | POA: Diagnosis not present

## 2020-07-16 DIAGNOSIS — R000 Tachycardia, unspecified: Secondary | ICD-10-CM | POA: Diagnosis not present

## 2020-07-16 LAB — CBC
HCT: 42.7 % (ref 36.0–46.0)
Hemoglobin: 13.9 g/dL (ref 12.0–15.0)
MCH: 29.4 pg (ref 26.0–34.0)
MCHC: 32.6 g/dL (ref 30.0–36.0)
MCV: 90.5 fL (ref 80.0–100.0)
Platelets: 235 10*3/uL (ref 150–400)
RBC: 4.72 MIL/uL (ref 3.87–5.11)
RDW: 12.1 % (ref 11.5–15.5)
WBC: 13 10*3/uL — ABNORMAL HIGH (ref 4.0–10.5)
nRBC: 0 % (ref 0.0–0.2)

## 2020-07-16 LAB — COMPREHENSIVE METABOLIC PANEL
ALT: 17 U/L (ref 0–44)
AST: 17 U/L (ref 15–41)
Albumin: 3.2 g/dL — ABNORMAL LOW (ref 3.5–5.0)
Alkaline Phosphatase: 73 U/L (ref 38–126)
Anion gap: 10 (ref 5–15)
BUN: 6 mg/dL (ref 6–20)
CO2: 20 mmol/L — ABNORMAL LOW (ref 22–32)
Calcium: 9.3 mg/dL (ref 8.9–10.3)
Chloride: 107 mmol/L (ref 98–111)
Creatinine, Ser: 0.61 mg/dL (ref 0.44–1.00)
GFR, Estimated: >60 mL/min (ref >60)
Glucose, Bld: 115 mg/dL — ABNORMAL HIGH (ref 70–99)
Potassium: 3.5 mmol/L (ref 3.5–5.1)
Sodium: 137 mmol/L (ref 135–145)
Total Bilirubin: 0.4 mg/dL (ref 0.3–1.2)
Total Protein: 7.1 g/dL (ref 6.5–8.1)

## 2020-07-16 LAB — TROPONIN I (HIGH SENSITIVITY)
Troponin I (High Sensitivity): 3 ng/L (ref <18)
Troponin I (High Sensitivity): 4 ng/L (ref <18)

## 2020-07-16 LAB — I-STAT BETA HCG BLOOD, ED (MC, WL, AP ONLY): I-stat hCG, quantitative: >2000 m[IU]/mL — ABNORMAL HIGH (ref <5)

## 2020-07-16 LAB — CBG MONITORING, ED: Glucose-Capillary: 134 mg/dL — ABNORMAL HIGH (ref 70–99)

## 2020-07-16 MED ORDER — LORAZEPAM 2 MG/ML IJ SOLN
1.0000 mg | Freq: Once | INTRAMUSCULAR | Status: AC
  Administered 2020-07-16: 21:00:00 1 mg via INTRAMUSCULAR
  Filled 2020-07-16: qty 1

## 2020-07-16 NOTE — Discharge Instructions (Addendum)
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 Please follow-up with your primary care doctor, I also want you to follow-up with outpatient neurology. If you have any new or worsening concerning symptoms please come back to the emergency department. Keep an eye on your blood pressure. I also want you to keep a diary as we discussed. Use the attached instructions. Follow-up with your OB. I also want you to follow-up with ophthalmology, Dr. Vonna Kotyk. Try make an appointment.  Get help right away if: You have: Severe eye pain. A severe headache. A sudden change in vision. A sudden loss of vision. A vision change after an injury. You notice flashing lights in your field of vision. Your field of vision is the area that you can see without moving your eyes.

## 2020-07-16 NOTE — ED Triage Notes (Signed)
Pt reports she is here today due to blurred vision at 12:30. Pt reports she lost complete vision in her left eye.Pt reports she now has since gain vision in back. 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 Pt reports she is always lightheaded and dizzy.Pt is also 11 weeks preg.

## 2020-07-16 NOTE — ED Notes (Addendum)
md at bedside, pt aao4, gcs15, nadn, vss on ccm. Family at bedside, side rails up. Call bell in reach

## 2020-07-16 NOTE — ED Notes (Signed)
Pa  at bedside. 

## 2020-07-16 NOTE — ED Provider Notes (Signed)
Medical screening examination/treatment/procedure(s) were conducted as a shared visit with non-physician practitioner(s) and myself.  I personally evaluated the patient during the encounter.  EKG Interpretation  Date/Time:  Monday July 16 2020 14:13:15 EST Ventricular Rate:  120 PR Interval:  146 QRS Duration: 74 QT Interval:  312 QTC Calculation: 440 R Axis:   82 Text Interpretation: Sinus tachycardia T wave abnormality, consider inferior ischemia Abnormal ECG no sig change from previous Confirmed by Arby Barrette [phone]) on 07/16/2020 11:00:35 PM  Patient was at work.  She had a sudden onset of first a wavy's blurred condition in the peripheral vision on the left eye.  This progressed to have loss of vision in the peripheral field.  Patient described if she brought her hand around in front of her face she could see it or almost see through it but to the periphery, there was no vision.  This resolved after about 30 minutes.  Patient reports after it occurred she did have a headache.  She does have history of vertigo this been longstanding.  She does get episodic headaches.  There were no other neurologic symptoms associated.  She has not been sick with fever chills or neck stiffness.  Patient is [redacted] weeks gestation.  Patient is alert and appropriate.  Mental status is clear.  Pupils are symmetric 3 mm and responsive.  Fundus exam on the left shows normal-appearing fundus with no areas suggestive of retinal detachment.  No papilledema.  Cranial nerves II through XII intact.  Normal finger-nose exam.  No motor deficits.  Speech and cognitive function normal.  Neurology has been consulted.  We will proceed with MRIs as suggested, I agree with plan of management.   Arby Barrette, MD 07/16/20 2303

## 2020-07-16 NOTE — ED Provider Notes (Signed)
[facility]   CSN: [id number] Arrival date & time: 07/16/20  1330     History Chief Complaint  Patient presents with  . Blurred Vision    Kelli Craig is a 29 y.o. female G1, P0 with pertinent past medical history for longstanding history of intermittent dizziness followed by neurology that presents to the emergency department today for blurred vision.  Patient states that around 1230 she started having some blurry vision on her left eye, progressed to complete vision loss in her periphery of her left eye.  States that the medial aspect of her left eye did not have complete vision loss, however did have blurry vision on that side.  No changes to right eye.  Denies any eye pain.  Denied any headache.  Denied any aura.  States that she is never had anything like this before.   Patient is currently [redacted] weeks pregnant.  States that while she has been waiting here she did start developing a headache in the frontal area of her head.  Was not there when she started having her vision changes.  Patient states that she is normally tachycardic and dizzy.  States that she always feels nauseous due to her pregnancy.  No new nausea.  No vomiting.  No fevers or chills.  No numbness or tingling.  No gait abnormality, syncope, weakness, aphasia, confusion, neglect.  Denies any other complaints.  Per chart review patient follows [facility] for her dizziness, did have a CT head in 2018 which was normal, patient recently had a normal tilt table test in 2021.  Has had multiple episodes of syncope with negative work-ups.  Did also have a Holter monitor performed in January 2021 which revealed prominently normal sinus rhythm.  Marland Kitchen.  HPI     History reviewed. No pertinent past medical history.  There are no problems to display for this patient.   History reviewed. No pertinent surgical history.   OB History    Gravida  1   Para      Term      Preterm       AB      Living        SAB      IAB      Ectopic      Multiple      Live Births              Family History  Problem Relation Age of Onset  . COPD Mother   . Cancer Maternal Aunt 49  . Cancer Paternal Aunt [id number]  . Hearing loss Paternal Grandmother [phone]    Social History   Tobacco Use  . Smoking status: Light Tobacco Smoker  . Smokeless tobacco: Never Used  . Tobacco comment: pt states she only smokes when she drinks alcohol  Substance Use Topics  . Alcohol use: Yes    Alcohol/week: 0.0 standard drinks    Comment: maybe once a month per pt  . Drug use: No    Home Medications Prior to Admission medications   Medication Sig Start Date End Date Taking? Authorizing Provider  escitalopram (LEXAPRO) 10 MG tablet Take 1 tablet (10 mg total) by mouth daily. 07/08/16   Junie SpencerHawks, Christy A, FNP  PRESCRIPTION MEDICATION Birth Control    [provider]  SRONYX 0.1-20 MG-MCG tablet TAKE 1 TABLET BY MOUTH DAILY. 05/28/17   Junie SpencerHawks, Christy A, FNP    Allergies    Patient has no known  allergies.  Review of Systems   Review of Systems  Constitutional: Negative for chills, diaphoresis, fatigue and fever.  HENT: Negative for congestion, sore throat and trouble swallowing.   Eyes: Positive for visual disturbance. Negative for pain.  Respiratory: Negative for cough, shortness of breath and wheezing.   Cardiovascular: Negative for chest pain, palpitations and leg swelling.  Gastrointestinal: Negative for abdominal distention, abdominal pain, diarrhea, nausea and vomiting.  Genitourinary: Negative for difficulty urinating.  Musculoskeletal: Negative for back pain, neck pain and neck stiffness.  Skin: Negative for pallor.  Neurological: Negative for dizziness, speech difficulty, weakness and headaches.  Psychiatric/Behavioral: Negative for confusion.    Physical Exam Updated Vital Signs BP 131/87   Pulse (!) 115   Temp 98.5 F (36.9 C) (Oral)   Resp 18   LMP 05/09/2020    SpO2 100%   Physical Exam Constitutional:      General: She is not in acute distress.    Appearance: Normal appearance. She is not ill-appearing, toxic-appearing or diaphoretic.  HENT:     Mouth/Throat:     Mouth: Mucous membranes are moist.     Pharynx: Oropharynx is clear.  Eyes:     General: Lids are normal. Vision grossly intact. No scleral icterus.    Extraocular Movements: Extraocular movements intact.     Right eye: Normal extraocular motion and no nystagmus.     Left eye: Normal extraocular motion and no nystagmus.     Conjunctiva/sclera:     Right eye: Right conjunctiva is not injected. No chemosis, exudate or hemorrhage.    Left eye: Left conjunctiva is not injected. No chemosis, exudate or hemorrhage.    Pupils: Pupils are equal, round, and reactive to light.     Right eye: Pupil is round, reactive and not sluggish.     Left eye: Pupil is round, reactive and not sluggish.     Funduscopic exam:    Right eye: No hemorrhage, exudate or papilledema.        Left eye: No hemorrhage, exudate or papilledema.     Comments: PERRLA, normal nonpainful EOMs.  No vision changes in all visual fields.  Cardiovascular:     Rate and Rhythm: Normal rate and regular rhythm.     Pulses: Normal pulses.     Heart sounds: Normal heart sounds.  Pulmonary:     Effort: Pulmonary effort is normal. No respiratory distress.     Breath sounds: Normal breath sounds. No stridor. No wheezing, rhonchi or rales.  Chest:     Chest wall: No tenderness.  Abdominal:     General: Abdomen is flat. There is no distension.     Palpations: Abdomen is soft.     Tenderness: There is no abdominal tenderness. There is no guarding or rebound.  Musculoskeletal:        General: No swelling or tenderness. Normal range of motion.     Cervical back: Normal range of motion and neck supple. No rigidity.     Right lower leg: No edema.     Left lower leg: No edema.  Skin:    General: Skin is warm and dry.      Capillary Refill: Capillary refill takes less than 2 seconds.     Coloration: Skin is not pale.  Neurological:     General: No focal deficit present.     Mental Status: She is alert and oriented to person, place, and time.  Psychiatric:        Mood and Affect:  Mood normal.        Behavior: Behavior normal.     ED Results / Procedures / Treatments   Labs (all labs ordered are listed, but only abnormal results are displayed) Labs Reviewed  CBC - Abnormal; Notable for the following components:      Result Value   WBC 13.0 (*)    All other components within normal limits  COMPREHENSIVE METABOLIC PANEL - Abnormal; Notable for the following components:   CO2 20 (*)    Glucose, Bld 115 (*)    Albumin 3.2 (*)    All other components within normal limits  CBG MONITORING, ED - Abnormal; Notable for the following components:   Glucose-Capillary 134 (*)    All other components within normal limits  I-STAT BETA HCG BLOOD, ED ([facility], [facility], [facility] ONLY) - Abnormal; Notable for the following components:   I-stat hCG, quantitative >2,000.0 (*)    All other components within normal limits  CBG MONITORING, ED  TROPONIN I (HIGH SENSITIVITY)  TROPONIN I (HIGH SENSITIVITY)    EKG None  Radiology MR BRAIN WO CONTRAST  Result Date: 07/16/2020 CLINICAL DATA:  Monocular vision loss EXAM: MRI HEAD WITHOUT CONTRAST TECHNIQUE: Multiplanar, multiecho pulse sequences of the brain and surrounding structures were obtained without intravenous contrast. COMPARISON:  None. FINDINGS: Brain: No acute infarct, mass effect or extra-axial collection. No acute or chronic hemorrhage. Normal white matter signal, parenchymal volume and CSF spaces. The midline structures are normal. Vascular: Major flow voids are preserved. Skull and upper cervical spine: Normal calvarium and skull base. Visualized upper cervical spine and soft tissues are normal. Sinuses/Orbits:No paranasal sinus fluid levels or advanced mucosal thickening. No  mastoid or middle ear effusion. Normal orbits. IMPRESSION: Normal brain MRI. Electronically Signed   By: Deatra Robinson M.D.   On: 07/16/2020 21:35   MR MRV HEAD WO CM  Result Date: 07/16/2020 CLINICAL DATA:  Monocular vision loss EXAM: MR VENOGRAM OF THE HEAD WITHOUT CONTRAST TECHNIQUE: Angiographic images of the intracranial venous structures were obtained using MRV technique without intravenous contrast. COMPARISON:  None. FINDINGS: Superior sagittal sinus: Normal. Straight sinus: Normal. Inferior sagittal sinus, vein of Galen and internal cerebral veins: Normal. Transverse sinuses: Diminutive left transverse sinus is likely a normal variant. Normal right transverse sinus. Sigmoid sinuses: Normal. Visualized jugular veins: Normal. IMPRESSION: No venous sinus thrombosis. Electronically Signed   By: Deatra Robinson M.D.   On: 07/16/2020 21:21    Procedures Procedures (including critical care time)  Medications Ordered in ED Medications  LORazepam (ATIVAN) injection 1 mg (1 mg Intramuscular Given 07/16/20 2032)    ED Course  I have reviewed the triage vital signs and the nursing notes.  Pertinent labs & imaging results that were available during my care of the patient were reviewed by me and considered in my medical decision making (see chart for details).    MDM Rules/Calculators/A&P                         Kelli Craig is a 29 y.o. female G1, P0 with pertinent past medical history for longstanding history of intermittent dizziness followed by neurology that presents to the emergency department today for blurred vision.  Nonpainful vision loss that has now completely resolved.  Normal neuro exam.  Differential to include ocular migraine, retinal vein or artery occlusion, mass, sinus thrombosis.  Will speak to neurology at this time for recommendations on imaging.  Patient is normotensive, no concerns for  eclampsia at this time. EKG is sinus tach, patient states that she is always tachycardic.  Did review patient's records and patient is chronically tachycardic, pulse 110 currently, normal rhythm.  Spoke to Dr. Jerrell Belfast, neurology who recommended MRI brain without contrast and also MRV without contrast to rule out venous thrombus. Pt requesting IV anxiety medications for sedation MRI, 1mg  of Ativan given, did speak to Dr. about this in regards to pt's pregnancy. Work-up today remarkable for leukocytosis of 13.  Otherwise CBC and CMP unremarkable.  Troponins negative.  MRI brain negative,MRV negative.  Patient to be discharged at this time, patient does not want any medication for headache. Suspect that this could be an ocular migraine, patient is currently asymptomatic. Has not had any vision changes while being in the emergency department for 9 hours. Will have patient follow-up with outpatient neurology and outpatient ophthalmology.  Also patient follow-up with OB and PCP. Patient agreeable for plan.   Doubt need for further emergent work up at this time. I explained the diagnosis and have given explicit precautions to return to the ER including for any other new or worsening symptoms. The patient understands and accepts the medical plan as it's been dictated and I have answered their questions. Discharge instructions concerning home care and prescriptions have been given. The patient is STABLE and is discharged to home in good condition.   I discussed this case with my attending physician who cosigned this note including patient's presenting symptoms, physical exam, and planned diagnostics and interventions. Attending physician stated agreement with plan or made changes to plan which were implemented.   Attending physician assessed patient at bedside.  Final Clinical Impression(s) / ED Diagnoses Final diagnoses:  Blurred vision    Rx / DC Orders ED Discharge Orders    None       Donnald Garre, PA-C 07/16/20 2249    07/18/20, MD 07/16/20 2303

## 2020-07-25 LAB — OB RESULTS CONSOLE RPR: RPR: NONREACTIVE

## 2020-07-25 LAB — OB RESULTS CONSOLE HEPATITIS B SURFACE ANTIGEN: Hepatitis B Surface Ag: NEGATIVE

## 2020-07-25 LAB — OB RESULTS CONSOLE HIV ANTIBODY (ROUTINE TESTING): HIV: NONREACTIVE

## 2020-07-25 LAB — OB RESULTS CONSOLE GC/CHLAMYDIA
Chlamydia: NEGATIVE
Gonorrhea: NEGATIVE

## 2020-07-25 LAB — OB RESULTS CONSOLE ANTIBODY SCREEN: Antibody Screen: NEGATIVE

## 2020-07-25 LAB — OB RESULTS CONSOLE ABO/RH: RH Type: POSITIVE

## 2020-07-25 LAB — OB RESULTS CONSOLE RUBELLA ANTIBODY, IGM: Rubella: IMMUNE

## 2020-08-27 ENCOUNTER — Telehealth: Payer: Self-pay

## 2020-08-27 ENCOUNTER — Other Ambulatory Visit: Payer: Self-pay | Admitting: Obstetrics and Gynecology

## 2020-08-27 DIAGNOSIS — Z363 Encounter for antenatal screening for malformations: Secondary | ICD-10-CM

## 2020-08-27 NOTE — Telephone Encounter (Signed)
Spoke with Amy from [facility] to schedule their patient for [place] [facility] OB COMP + 14 WK. Patient is being referred by Philip Aspen.   Was able to get the patient scheduled for 09/13/2020 at 8:45am. She will notify the patient of the appointment information.   Sending patient New Patient Letter and [facility] map via MyChart.

## 2020-09-13 ENCOUNTER — Other Ambulatory Visit: Payer: Self-pay

## 2020-09-13 ENCOUNTER — Other Ambulatory Visit: Payer: Self-pay | Admitting: *Deleted

## 2020-09-13 ENCOUNTER — Ambulatory Visit: Payer: PRIVATE HEALTH INSURANCE | Attending: Obstetrics and Gynecology

## 2020-09-13 DIAGNOSIS — Z362 Encounter for other antenatal screening follow-up: Secondary | ICD-10-CM

## 2020-09-13 DIAGNOSIS — Z363 Encounter for antenatal screening for malformations: Secondary | ICD-10-CM

## 2020-10-11 ENCOUNTER — Ambulatory Visit: Payer: No Typology Code available for payment source | Attending: Obstetrics and Gynecology

## 2021-01-16 LAB — OB RESULTS CONSOLE GBS: GBS: POSITIVE

## 2021-02-01 ENCOUNTER — Telehealth (HOSPITAL_COMMUNITY): Payer: Self-pay | Admitting: *Deleted

## 2021-02-01 ENCOUNTER — Encounter (HOSPITAL_COMMUNITY): Payer: Self-pay | Admitting: *Deleted

## 2021-02-01 NOTE — Telephone Encounter (Signed)
Preadmission screen  

## 2021-02-06 ENCOUNTER — Other Ambulatory Visit (HOSPITAL_COMMUNITY)
Admission: RE | Admit: 2021-02-06 | Discharge: 2021-02-06 | Disposition: A | Discharge status: Home / Self Care | Payer: PRIVATE HEALTH INSURANCE | Source: Ambulatory Visit | Attending: Obstetrics & Gynecology | Admitting: Obstetrics & Gynecology

## 2021-02-06 DIAGNOSIS — Z01812 Encounter for preprocedural laboratory examination: Secondary | ICD-10-CM | POA: Insufficient documentation

## 2021-02-06 DIAGNOSIS — Z20822 Contact with and (suspected) exposure to covid-19: Secondary | ICD-10-CM | POA: Insufficient documentation

## 2021-02-06 LAB — SARS CORONAVIRUS 2 (TAT 6-24 HRS): SARS Coronavirus 2: NEGATIVE

## 2021-02-07 ENCOUNTER — Other Ambulatory Visit: Payer: Self-pay

## 2021-02-07 ENCOUNTER — Inpatient Hospital Stay (HOSPITAL_COMMUNITY): Admit: 2021-02-07 | Payer: Self-pay

## 2021-02-07 ENCOUNTER — Other Ambulatory Visit: Payer: Self-pay | Admitting: Obstetrics and Gynecology

## 2021-02-08 ENCOUNTER — Inpatient Hospital Stay (HOSPITAL_COMMUNITY): Payer: PRIVATE HEALTH INSURANCE | Admitting: Anesthesiology

## 2021-02-08 ENCOUNTER — Other Ambulatory Visit: Payer: Self-pay

## 2021-02-08 ENCOUNTER — Inpatient Hospital Stay (HOSPITAL_COMMUNITY): Payer: PRIVATE HEALTH INSURANCE

## 2021-02-08 ENCOUNTER — Inpatient Hospital Stay (HOSPITAL_COMMUNITY)
Admission: AD | Admit: 2021-02-08 | Discharge: 2021-02-12 | DRG: 787 | Disposition: A | Discharge status: Home / Self Care | Payer: PRIVATE HEALTH INSURANCE | Attending: Obstetrics and Gynecology | Admitting: Obstetrics and Gynecology

## 2021-02-08 ENCOUNTER — Encounter (HOSPITAL_COMMUNITY): Payer: Self-pay | Admitting: Obstetrics and Gynecology

## 2021-02-08 DIAGNOSIS — Z3A40 40 weeks gestation of pregnancy: Secondary | ICD-10-CM

## 2021-02-08 DIAGNOSIS — O99824 Streptococcus B carrier state complicating childbirth: Secondary | ICD-10-CM | POA: Diagnosis present

## 2021-02-08 DIAGNOSIS — O9081 Anemia of the puerperium: Secondary | ICD-10-CM | POA: Diagnosis not present

## 2021-02-08 DIAGNOSIS — Z20822 Contact with and (suspected) exposure to covid-19: Secondary | ICD-10-CM | POA: Diagnosis present

## 2021-02-08 DIAGNOSIS — Z87891 Personal history of nicotine dependence: Secondary | ICD-10-CM

## 2021-02-08 DIAGNOSIS — D62 Acute posthemorrhagic anemia: Secondary | ICD-10-CM | POA: Diagnosis not present

## 2021-02-08 DIAGNOSIS — O99214 Obesity complicating childbirth: Principal | ICD-10-CM | POA: Diagnosis present

## 2021-02-08 DIAGNOSIS — O3663X0 Maternal care for excessive fetal growth, third trimester, not applicable or unspecified: Secondary | ICD-10-CM | POA: Diagnosis present

## 2021-02-08 DIAGNOSIS — Z3490 Encounter for supervision of normal pregnancy, unspecified, unspecified trimester: Secondary | ICD-10-CM

## 2021-02-08 HISTORY — DX: Encounter for supervision of normal pregnancy, unspecified, unspecified trimester: Z34.90

## 2021-02-08 LAB — CBC
HCT: 36.1 % (ref 36.0–46.0)
Hemoglobin: 11.5 g/dL — ABNORMAL LOW (ref 12.0–15.0)
MCH: 27.8 pg (ref 26.0–34.0)
MCHC: 31.9 g/dL (ref 30.0–36.0)
MCV: 87.4 fL (ref 80.0–100.0)
Platelets: 188 10*3/uL (ref 150–400)
RBC: 4.13 MIL/uL (ref 3.87–5.11)
RDW: 15 % (ref 11.5–15.5)
WBC: 14.2 10*3/uL — ABNORMAL HIGH (ref 4.0–10.5)
nRBC: 0 % (ref 0.0–0.2)

## 2021-02-08 LAB — TYPE AND SCREEN
ABO/RH(D): O POS
Antibody Screen: NEGATIVE

## 2021-02-08 LAB — RPR: RPR Ser Ql: NONREACTIVE

## 2021-02-08 MED ORDER — OXYCODONE-ACETAMINOPHEN 5-325 MG PO TABS: 1.0000 | ORAL_TABLET | ORAL | Status: DC | PRN

## 2021-02-08 MED ORDER — TERBUTALINE SULFATE 1 MG/ML IJ SOLN: 0.2500 mg | Freq: Once | INTRAMUSCULAR | Status: DC | PRN

## 2021-02-08 MED ORDER — LACTATED RINGERS IV SOLN
INTRAVENOUS | Status: DC
  Administered 2021-02-09: 125 mL/h via INTRAVENOUS

## 2021-02-08 MED ORDER — ACETAMINOPHEN 325 MG PO TABS: 650.0000 mg | ORAL_TABLET | ORAL | Status: DC | PRN

## 2021-02-08 MED ORDER — LIDOCAINE HCL (PF) 1 % IJ SOLN
INTRAMUSCULAR | Status: DC | PRN
  Administered 2021-02-08: 10 mL via EPIDURAL
  Administered 2021-02-08: 2 mL via EPIDURAL

## 2021-02-08 MED ORDER — EPHEDRINE 5 MG/ML INJ: 10.0000 mg | INTRAVENOUS | Status: DC | PRN

## 2021-02-08 MED ORDER — FENTANYL-BUPIVACAINE-NACL 0.5-0.125-0.9 MG/250ML-% EP SOLN
EPIDURAL | Status: DC | PRN
  Administered 2021-02-08 – 2021-02-09 (×2): 12 mL/h via EPIDURAL

## 2021-02-08 MED ORDER — BUTORPHANOL TARTRATE 1 MG/ML IJ SOLN
1.0000 mg | INTRAMUSCULAR | Status: DC | PRN
  Administered 2021-02-08: 1 mg via INTRAVENOUS
  Filled 2021-02-08: qty 1

## 2021-02-08 MED ORDER — LACTATED RINGERS IV SOLN: 500.0000 mL | Freq: Once | INTRAVENOUS | Status: DC

## 2021-02-08 MED ORDER — FENTANYL-BUPIVACAINE-NACL 0.5-0.125-0.9 MG/250ML-% EP SOLN
12.0000 mL/h | EPIDURAL | Status: DC | PRN
Start: 2021-02-08 — End: 2021-02-09
  Filled 2021-02-08 (×2): qty 250

## 2021-02-08 MED ORDER — MISOPROSTOL 25 MCG QUARTER TABLET
25.0000 ug | ORAL_TABLET | ORAL | Status: DC | PRN
  Administered 2021-02-08 (×4): 25 ug via VAGINAL
  Filled 2021-02-08 (×4): qty 1

## 2021-02-08 MED ORDER — ONDANSETRON HCL 4 MG/2ML IJ SOLN
4.0000 mg | Freq: Four times a day (QID) | INTRAMUSCULAR | Status: DC | PRN
  Administered 2021-02-08: 4 mg via INTRAVENOUS
  Filled 2021-02-08: qty 2

## 2021-02-08 MED ORDER — SODIUM CHLORIDE 0.9 % IV SOLN
5.0000 10*6.[IU] | Freq: Once | INTRAVENOUS | Status: AC
  Administered 2021-02-08: 5 10*6.[IU] via INTRAVENOUS
  Filled 2021-02-08: qty 5

## 2021-02-08 MED ORDER — PHENYLEPHRINE 40 MCG/ML (10ML) SYRINGE FOR IV PUSH (FOR BLOOD PRESSURE SUPPORT): 80.0000 ug | PREFILLED_SYRINGE | INTRAVENOUS | Status: DC | PRN

## 2021-02-08 MED ORDER — DIPHENHYDRAMINE HCL 50 MG/ML IJ SOLN: 12.5000 mg | INTRAMUSCULAR | Status: DC | PRN

## 2021-02-08 MED ORDER — OXYCODONE-ACETAMINOPHEN 5-325 MG PO TABS: 2.0000 | ORAL_TABLET | ORAL | Status: DC | PRN

## 2021-02-08 MED ORDER — SOD CITRATE-CITRIC ACID 500-334 MG/5ML PO SOLN
30.0000 mL | ORAL | Status: DC | PRN
  Administered 2021-02-09: 30 mL via ORAL
  Filled 2021-02-08: qty 30

## 2021-02-08 MED ORDER — PENICILLIN G POT IN DEXTROSE 60000 UNIT/ML IV SOLN
3.0000 10*6.[IU] | INTRAVENOUS | Status: DC
  Administered 2021-02-08 – 2021-02-09 (×9): 3 10*6.[IU] via INTRAVENOUS
  Filled 2021-02-08 (×9): qty 50

## 2021-02-08 MED ORDER — LACTATED RINGERS IV SOLN
500.0000 mL | INTRAVENOUS | Status: DC | PRN
  Administered 2021-02-08: 500 mL via INTRAVENOUS

## 2021-02-08 MED ORDER — LIDOCAINE HCL (PF) 1 % IJ SOLN: 30.0000 mL | INTRAMUSCULAR | Status: DC | PRN

## 2021-02-08 MED ORDER — PHENYLEPHRINE 40 MCG/ML (10ML) SYRINGE FOR IV PUSH (FOR BLOOD PRESSURE SUPPORT)
80.0000 ug | PREFILLED_SYRINGE | INTRAVENOUS | Status: DC | PRN
  Administered 2021-02-08: 80 ug via INTRAVENOUS
  Filled 2021-02-08: qty 10

## 2021-02-08 MED ORDER — OXYTOCIN-SODIUM CHLORIDE 30-0.9 UT/500ML-% IV SOLN
1.0000 m[IU]/min | INTRAVENOUS | Status: DC
  Administered 2021-02-08: 2 m[IU]/min via INTRAVENOUS
  Filled 2021-02-08: qty 500

## 2021-02-08 MED ORDER — OXYTOCIN-SODIUM CHLORIDE 30-0.9 UT/500ML-% IV SOLN: 2.5000 [IU]/h | INTRAVENOUS | Status: DC

## 2021-02-08 MED ORDER — OXYTOCIN BOLUS FROM INFUSION: 333.0000 mL | Freq: Once | INTRAVENOUS | Status: DC

## 2021-02-08 NOTE — H&P (Addendum)
30 y.o. [redacted]w[redacted]d  G1P0 comes in c/o schedule IOL for term obesity.  Otherwise has good fetal movement and no bleeding.  Past Medical History:  Diagnosis Date   Tachycardia    History reviewed. No pertinent surgical history.  OB History  Gravida Para Term Preterm AB Living  1            SAB IAB Ectopic Multiple Live Births               # Outcome Date GA Lbr Len/2nd Weight Sex Delivery Anes PTL Lv  1 Current             Social History   Socioeconomic History   Marital status: Married    Spouse name: Not on file   Number of children: Not on file   Years of education: Not on file   Highest education level: Not on file  Occupational History   Not on file  Tobacco Use   Smoking status: Former    Types: Cigarettes    Quit date: 07/21/2017    Years since quitting: 3.5   Smokeless tobacco: Never   Tobacco comments:    pt states she only smokes when she drinks alcohol  Substance and Sexual Activity   Alcohol use: Yes    Alcohol/week: 0.0 standard drinks    Comment: maybe once a month per pt   Drug use: No   Sexual activity: Yes    Partners: Male  Other Topics Concern   Not on file  Social History Narrative   Not on file   Social Determinants of Health   Financial Resource Strain: Not on file  Food Insecurity: Not on file  Transportation Needs: Not on file  Physical Activity: Not on file  Stress: Not on file  Social Connections: Not on file  Intimate Partner Violence: Not on file   Patient has no known allergies.    Prenatal Transfer Tool  Maternal Diabetes: No Genetic Screening: Normal Maternal Ultrasounds/Referrals: Other:MFM anatomy scan: limited views, marginal cord insertion, low lying placenta resolved on f/u.  Heart views, face, spine, extremities limited views. Fetal Ultrasounds or other Referrals:  Referred to Materal Fetal Medicine  Maternal Substance Abuse:  No Significant Maternal Medications:  None Significant Maternal Lab Results: Group B Strep  positive  Other PNC: obesity, initla BMI >35.  Most recent fetal US 02/06/2021: EFW 8#13 (87%)    Vitals:   02/08/21 0644 02/08/21 0725 02/08/21 0827 02/08/21 0904  BP: 113/67 130/78 (!) 135/95 139/82  Pulse: 99 94 97 99  Resp: 18 18    Temp:  98.1 F (36.7 C)    TempSrc:  Oral    Weight:      Height:        Lungs/Cor:  NAD Abdomen:  soft, gravid Ex:  no cords, erythema SVE:  1/thick/B FHTs:  139m good STV, NST R; Cat 1 tracing. Toco:  q 1-3   A/P   Admitted for IOL at 40.1 with obesity  Cytotec for cervical ripening, anticipate AROM/pitocin, possible foley bulb  GBS +, PCN  EFW by Simonne Come: 8.5-9#  Other routine care Philip Aspen

## 2021-02-08 NOTE — Anesthesia Procedure Notes (Signed)
Epidural Patient location during procedure: OB Start time: 02/08/2021 9:24 PM End time: 02/08/2021 9:32 PM  Staffing Anesthesiologist: Lannie Fields, DO Performed: anesthesiologist   Preanesthetic Checklist Completed: patient identified, IV checked, risks and benefits discussed, monitors and equipment checked, pre-op evaluation and timeout performed  Epidural Patient position: sitting Prep: DuraPrep and site prepped and draped Patient monitoring: continuous pulse ox, blood pressure, heart rate and cardiac monitor Approach: midline Location: L3-L4 Injection technique: LOR air  Needle:  Needle type: Tuohy  Needle gauge: 17 G Needle length: 9 cm Needle insertion depth: 7 cm Catheter type: closed end flexible Catheter size: 19 Gauge Catheter at skin depth: 12 cm Test dose: negative  Assessment Sensory level: T8 Events: blood not aspirated, injection not painful, no injection resistance, no paresthesia and negative IV test  Additional Notes Patient identified. Risks/Benefits/Options discussed with patient including but not limited to bleeding, infection, nerve damage, paralysis, failed block, incomplete pain control, headache, blood pressure changes, nausea, vomiting, reactions to medication both or allergic, itching and postpartum back pain. Confirmed with bedside nurse the patient's most recent platelet count. Confirmed with patient that they are not currently taking any anticoagulation, have any bleeding history or any family history of bleeding disorders. Patient expressed understanding and wished to proceed. All questions were answered. Sterile technique was used throughout the entire procedure. Please see nursing notes for vital signs. Test dose was given through epidural catheter and negative prior to continuing to dose epidural or start infusion. Warning signs of high block given to the patient including shortness of breath, tingling/numbness in hands, complete motor  block, or any concerning symptoms with instructions to call for help. Patient was given instructions on fall risk and not to get out of bed. All questions and concerns addressed with instructions to call with any issues or inadequate analgesia.  Reason for block:procedure for pain

## 2021-02-08 NOTE — Anesthesia Preprocedure Evaluation (Addendum)
Anesthesia Evaluation  Patient identified by MRN, date of birth, ID band Patient awake    Reviewed: Allergy & Precautions, Patient's Chart, lab work & pertinent test results  Airway Mallampati: II  TM Distance: >3 FB Neck ROM: Full    Dental no notable dental hx. (+) Dental Advisory Given   Pulmonary former smoker,    Pulmonary exam normal breath sounds clear to auscultation       Cardiovascular negative cardio ROS Normal cardiovascular exam Rhythm:Regular Rate:Normal     Neuro/Psych negative neurological ROS  negative psych ROS   GI/Hepatic negative GI ROS, Neg liver ROS,   Endo/Other  Morbid obesityBMI 44  Renal/GU negative Renal ROS  negative genitourinary   Musculoskeletal negative musculoskeletal ROS (+)   Abdominal Normal abdominal exam  (+) + obese,   Peds negative pediatric ROS (+)  Hematology negative hematology ROS (+) hc 36.1, plt 188   Anesthesia Other Findings   Reproductive/Obstetrics (+) Pregnancy                             Anesthesia Physical Anesthesia Plan  ASA: 3  Anesthesia Plan: Epidural   Post-op Pain Management:    Induction:   PONV Risk Score and Plan: 2  Airway Management Planned: Natural Airway  Additional Equipment: None  Intra-op Plan:   Post-operative Plan:   Informed Consent: I have reviewed the patients History and Physical, chart, labs and discussed the procedure including the risks, benefits and alternatives for the proposed anesthesia with the patient or authorized representative who has indicated his/her understanding and acceptance.     Dental advisory given  Plan Discussed with: Anesthesiologist and CRNA  Anesthesia Plan Comments: (Will plan to dose epidural for surgical anesthesia. GETA vs. Spinal as backup plan. )       Anesthesia Quick Evaluation

## 2021-02-08 NOTE — Progress Notes (Signed)
Pt starting to feel more cramping, otherwise doing well. FHT: 130 mod var, +accels, no decels TOCO: 2-8 SVE 2/50/B A/P: IOL term S/p cytotec x 4 doses FB placed, will add pit 2x2 up to 6 Other routine care Epidural ok when desired.

## 2021-02-09 ENCOUNTER — Encounter (HOSPITAL_COMMUNITY)
Admission: AD | Disposition: A | Discharge status: Home / Self Care | Payer: Self-pay | Source: Home / Self Care | Attending: Obstetrics and Gynecology

## 2021-02-09 ENCOUNTER — Encounter (HOSPITAL_COMMUNITY): Payer: Self-pay | Admitting: Obstetrics and Gynecology

## 2021-02-09 SURGERY — Surgical Case
Anesthesia: Epidural

## 2021-02-09 MED ORDER — ACETAMINOPHEN 10 MG/ML IV SOLN
1000.0000 mg | Freq: Once | INTRAVENOUS | Status: DC | PRN
  Administered 2021-02-09: 1000 mg via INTRAVENOUS

## 2021-02-09 MED ORDER — ACETAMINOPHEN 325 MG PO TABS
650.0000 mg | ORAL_TABLET | ORAL | Status: DC | PRN
  Administered 2021-02-12 (×2): 650 mg via ORAL
  Filled 2021-02-09 (×3): qty 2

## 2021-02-09 MED ORDER — MENTHOL 3 MG MT LOZG: 1.0000 | LOZENGE | OROMUCOSAL | Status: DC | PRN

## 2021-02-09 MED ORDER — NALBUPHINE HCL 10 MG/ML IJ SOLN: 5.0000 mg | INTRAMUSCULAR | Status: DC | PRN

## 2021-02-09 MED ORDER — LACTATED RINGERS IV SOLN: INTRAVENOUS | Status: DC | PRN

## 2021-02-09 MED ORDER — MORPHINE SULFATE (PF) 0.5 MG/ML IJ SOLN
INTRAMUSCULAR | Status: DC | PRN
  Administered 2021-02-09: 3 mg via EPIDURAL

## 2021-02-09 MED ORDER — ONDANSETRON HCL 4 MG/2ML IJ SOLN
INTRAMUSCULAR | Status: DC | PRN
  Administered 2021-02-09: 4 mg via INTRAVENOUS

## 2021-02-09 MED ORDER — SODIUM CHLORIDE 0.9 % IV SOLN
500.0000 mg | Freq: Once | INTRAVENOUS | Status: AC
  Administered 2021-02-09: 500 mg via INTRAVENOUS

## 2021-02-09 MED ORDER — OXYCODONE HCL 5 MG PO TABS
5.0000 mg | ORAL_TABLET | ORAL | Status: DC | PRN
  Administered 2021-02-12 (×3): 5 mg via ORAL
  Filled 2021-02-09 (×3): qty 1

## 2021-02-09 MED ORDER — CEFAZOLIN SODIUM-DEXTROSE 2-4 GM/100ML-% IV SOLN
INTRAVENOUS | Status: AC
  Filled 2021-02-09: qty 100

## 2021-02-09 MED ORDER — OXYCODONE HCL 5 MG PO TABS: 5.0000 mg | ORAL_TABLET | Freq: Once | ORAL | Status: DC | PRN

## 2021-02-09 MED ORDER — PRENATAL MULTIVITAMIN CH
1.0000 | ORAL_TABLET | Freq: Every day | ORAL | Status: DC
  Administered 2021-02-11 – 2021-02-12 (×2): 1 via ORAL
  Filled 2021-02-09 (×3): qty 1

## 2021-02-09 MED ORDER — FENTANYL CITRATE (PF) 100 MCG/2ML IJ SOLN
INTRAMUSCULAR | Status: AC
  Filled 2021-02-09: qty 2

## 2021-02-09 MED ORDER — NALBUPHINE HCL 10 MG/ML IJ SOLN
5.0000 mg | Freq: Once | INTRAMUSCULAR | Status: AC | PRN
  Administered 2021-02-09: 5 mg via INTRAVENOUS

## 2021-02-09 MED ORDER — LIDOCAINE-EPINEPHRINE (PF) 2 %-1:200000 IJ SOLN
INTRAMUSCULAR | Status: DC | PRN
  Administered 2021-02-09: 4 mL via EPIDURAL
  Administered 2021-02-09: 6 mL via EPIDURAL

## 2021-02-09 MED ORDER — ONDANSETRON HCL 4 MG/2ML IJ SOLN
INTRAMUSCULAR | Status: AC
  Filled 2021-02-09: qty 2

## 2021-02-09 MED ORDER — SODIUM CHLORIDE 0.9 % IR SOLN
Status: DC | PRN
  Administered 2021-02-09: 1

## 2021-02-09 MED ORDER — NALBUPHINE HCL 10 MG/ML IJ SOLN
INTRAMUSCULAR | Status: AC
  Filled 2021-02-09: qty 1

## 2021-02-09 MED ORDER — ONDANSETRON HCL 4 MG/2ML IJ SOLN: 4.0000 mg | Freq: Three times a day (TID) | INTRAMUSCULAR | Status: DC | PRN

## 2021-02-09 MED ORDER — PHENYLEPHRINE 40 MCG/ML (10ML) SYRINGE FOR IV PUSH (FOR BLOOD PRESSURE SUPPORT)
PREFILLED_SYRINGE | INTRAVENOUS | Status: AC
  Filled 2021-02-09: qty 10

## 2021-02-09 MED ORDER — STERILE WATER FOR IRRIGATION IR SOLN
Status: DC | PRN
  Administered 2021-02-09: 1

## 2021-02-09 MED ORDER — MORPHINE SULFATE (PF) 0.5 MG/ML IJ SOLN
INTRAMUSCULAR | Status: AC
  Filled 2021-02-09: qty 10

## 2021-02-09 MED ORDER — OXYCODONE HCL 5 MG/5ML PO SOLN: 5.0000 mg | Freq: Once | ORAL | Status: DC | PRN

## 2021-02-09 MED ORDER — DIPHENHYDRAMINE HCL 25 MG PO CAPS: 25.0000 mg | ORAL_CAPSULE | Freq: Four times a day (QID) | ORAL | Status: DC | PRN

## 2021-02-09 MED ORDER — KETOROLAC TROMETHAMINE 30 MG/ML IJ SOLN
INTRAMUSCULAR | Status: AC
  Filled 2021-02-09: qty 1

## 2021-02-09 MED ORDER — FENTANYL CITRATE (PF) 100 MCG/2ML IJ SOLN
INTRAMUSCULAR | Status: DC | PRN
  Administered 2021-02-09: 100 ug via EPIDURAL

## 2021-02-09 MED ORDER — CEFAZOLIN SODIUM-DEXTROSE 2-4 GM/100ML-% IV SOLN
2.0000 g | Freq: Once | INTRAVENOUS | Status: AC
  Administered 2021-02-09: 2 g via INTRAVENOUS

## 2021-02-09 MED ORDER — KETOROLAC TROMETHAMINE 30 MG/ML IJ SOLN: 30.0000 mg | Freq: Four times a day (QID) | INTRAMUSCULAR | Status: AC | PRN

## 2021-02-09 MED ORDER — PHENYLEPHRINE 40 MCG/ML (10ML) SYRINGE FOR IV PUSH (FOR BLOOD PRESSURE SUPPORT)
PREFILLED_SYRINGE | INTRAVENOUS | Status: DC | PRN
  Administered 2021-02-09: 80 ug via INTRAVENOUS
  Administered 2021-02-09 (×2): 120 ug via INTRAVENOUS

## 2021-02-09 MED ORDER — DIPHENHYDRAMINE HCL 25 MG PO CAPS: 25.0000 mg | ORAL_CAPSULE | ORAL | Status: DC | PRN

## 2021-02-09 MED ORDER — KETOROLAC TROMETHAMINE 30 MG/ML IJ SOLN
30.0000 mg | Freq: Four times a day (QID) | INTRAMUSCULAR | Status: AC | PRN
  Administered 2021-02-09 – 2021-02-10 (×3): 30 mg via INTRAVENOUS
  Filled 2021-02-09 (×2): qty 1

## 2021-02-09 MED ORDER — SODIUM CHLORIDE 0.9% FLUSH: 3.0000 mL | INTRAVENOUS | Status: DC | PRN

## 2021-02-09 MED ORDER — SODIUM BICARBONATE 8.4 % IV SOLN
INTRAVENOUS | Status: AC
  Filled 2021-02-09: qty 50

## 2021-02-09 MED ORDER — NALBUPHINE HCL 10 MG/ML IJ SOLN: 5.0000 mg | Freq: Once | INTRAMUSCULAR | Status: AC | PRN

## 2021-02-09 MED ORDER — DIPHENHYDRAMINE HCL 50 MG/ML IJ SOLN: 12.5000 mg | INTRAMUSCULAR | Status: DC | PRN

## 2021-02-09 MED ORDER — OXYTOCIN-SODIUM CHLORIDE 30-0.9 UT/500ML-% IV SOLN
2.5000 [IU]/h | INTRAVENOUS | Status: AC
  Administered 2021-02-10: 2.5 [IU]/h via INTRAVENOUS
  Filled 2021-02-09: qty 500

## 2021-02-09 MED ORDER — NALOXONE HCL 0.4 MG/ML IJ SOLN: 0.4000 mg | INTRAMUSCULAR | Status: DC | PRN

## 2021-02-09 MED ORDER — PROMETHAZINE HCL 25 MG/ML IJ SOLN: 6.2500 mg | INTRAMUSCULAR | Status: DC | PRN

## 2021-02-09 MED ORDER — COCONUT OIL OIL: 1.0000 "application " | TOPICAL_OIL | Status: DC | PRN

## 2021-02-09 MED ORDER — OXYTOCIN-SODIUM CHLORIDE 30-0.9 UT/500ML-% IV SOLN
INTRAVENOUS | Status: DC | PRN
  Administered 2021-02-09: 300 mL via INTRAVENOUS

## 2021-02-09 MED ORDER — ACETAMINOPHEN 500 MG PO TABS
1000.0000 mg | ORAL_TABLET | Freq: Four times a day (QID) | ORAL | Status: AC
  Administered 2021-02-10 (×3): 1000 mg via ORAL
  Filled 2021-02-09 (×3): qty 2

## 2021-02-09 MED ORDER — SODIUM CHLORIDE 0.9 % IV SOLN
INTRAVENOUS | Status: AC
  Filled 2021-02-09: qty 500

## 2021-02-09 MED ORDER — SENNOSIDES-DOCUSATE SODIUM 8.6-50 MG PO TABS
2.0000 | ORAL_TABLET | Freq: Every day | ORAL | Status: DC
  Administered 2021-02-10 – 2021-02-11 (×2): 2 via ORAL
  Filled 2021-02-09 (×3): qty 2

## 2021-02-09 MED ORDER — WITCH HAZEL-GLYCERIN EX PADS: 1.0000 "application " | MEDICATED_PAD | CUTANEOUS | Status: DC | PRN

## 2021-02-09 MED ORDER — TETANUS-DIPHTH-ACELL PERTUSSIS 5-2.5-18.5 LF-MCG/0.5 IM SUSY: 0.5000 mL | PREFILLED_SYRINGE | Freq: Once | INTRAMUSCULAR | Status: DC

## 2021-02-09 MED ORDER — DIBUCAINE (PERIANAL) 1 % EX OINT: 1.0000 "application " | TOPICAL_OINTMENT | CUTANEOUS | Status: DC | PRN

## 2021-02-09 MED ORDER — FENTANYL CITRATE (PF) 100 MCG/2ML IJ SOLN: 25.0000 ug | INTRAMUSCULAR | Status: DC | PRN

## 2021-02-09 MED ORDER — ACETAMINOPHEN 10 MG/ML IV SOLN
INTRAVENOUS | Status: AC
  Filled 2021-02-09: qty 100

## 2021-02-09 MED ORDER — NALOXONE HCL 4 MG/10ML IJ SOLN
1.0000 ug/kg/h | INTRAVENOUS | Status: DC | PRN
  Filled 2021-02-09: qty 5

## 2021-02-09 MED ORDER — SIMETHICONE 80 MG PO CHEW
80.0000 mg | CHEWABLE_TABLET | ORAL | Status: DC | PRN
  Administered 2021-02-10: 80 mg via ORAL
  Filled 2021-02-09: qty 1

## 2021-02-09 MED ORDER — ZOLPIDEM TARTRATE 5 MG PO TABS: 5.0000 mg | ORAL_TABLET | Freq: Every evening | ORAL | Status: DC | PRN

## 2021-02-09 MED ORDER — SIMETHICONE 80 MG PO CHEW
80.0000 mg | CHEWABLE_TABLET | Freq: Three times a day (TID) | ORAL | Status: DC
  Administered 2021-02-10 – 2021-02-12 (×6): 80 mg via ORAL
  Filled 2021-02-09 (×6): qty 1

## 2021-02-09 MED ORDER — MEPERIDINE HCL 25 MG/ML IJ SOLN: 6.2500 mg | INTRAMUSCULAR | Status: DC | PRN

## 2021-02-09 SURGICAL SUPPLY — 32 items
BENZOIN TINCTURE PRP APPL 2/3 (GAUZE/BANDAGES/DRESSINGS) ×2 IMPLANT
CHLORAPREP W/TINT 26ML (MISCELLANEOUS) ×2 IMPLANT
CLAMP CORD UMBIL (MISCELLANEOUS) IMPLANT
CLOTH BEACON ORANGE TIMEOUT ST (SAFETY) ×2 IMPLANT
DRSG OPSITE POSTOP 4X10 (GAUZE/BANDAGES/DRESSINGS) ×2 IMPLANT
ELECT REM PT RETURN 9FT ADLT (ELECTROSURGICAL) ×2
ELECTRODE REM PT RTRN 9FT ADLT (ELECTROSURGICAL) ×1 IMPLANT
EXTRACTOR VACUUM M CUP 4 TUBE (SUCTIONS) IMPLANT
GLOVE BIOGEL PI IND STRL 6.5 (GLOVE) ×1 IMPLANT
GLOVE BIOGEL PI IND STRL 7.0 (GLOVE) ×1 IMPLANT
GLOVE BIOGEL PI INDICATOR 6.5 (GLOVE) ×1
GLOVE BIOGEL PI INDICATOR 7.0 (GLOVE) ×1
GLOVE ECLIPSE 6.5 STRL STRAW (GLOVE) ×2 IMPLANT
GOWN STRL REUS W/TWL LRG LVL3 (GOWN DISPOSABLE) ×4 IMPLANT
KIT ABG SYR 3ML LUER SLIP (SYRINGE) IMPLANT
NEEDLE HYPO 25X5/8 SAFETYGLIDE (NEEDLE) IMPLANT
NS IRRIG 1000ML POUR BTL (IV SOLUTION) ×2 IMPLANT
PACK C SECTION WH (CUSTOM PROCEDURE TRAY) ×2 IMPLANT
PAD ABD 7.5X8 STRL (GAUZE/BANDAGES/DRESSINGS) IMPLANT
PAD OB MATERNITY 4.3X12.25 (PERSONAL CARE ITEMS) ×2 IMPLANT
PENCIL SMOKE EVAC W/HOLSTER (ELECTROSURGICAL) ×2 IMPLANT
RTRCTR C-SECT PINK 25CM LRG (MISCELLANEOUS) ×2 IMPLANT
STRIP CLOSURE SKIN 1/4X4 (GAUZE/BANDAGES/DRESSINGS) ×2 IMPLANT
SUT MON AB 2-0 CT1 27 (SUTURE) ×2 IMPLANT
SUT PDS AB 0 CTX 60 (SUTURE) IMPLANT
SUT PLAIN 2 0 XLH (SUTURE) IMPLANT
SUT VIC AB 0 CTX 36 (SUTURE) ×4
SUT VIC AB 0 CTX36XBRD ANBCTRL (SUTURE) ×4 IMPLANT
SUT VIC AB 4-0 KS 27 (SUTURE) ×4 IMPLANT
TOWEL OR 17X24 6PK STRL BLUE (TOWEL DISPOSABLE) ×2 IMPLANT
TRAY FOLEY W/BAG SLVR 14FR LF (SET/KITS/TRAYS/PACK) ×2 IMPLANT
WATER STERILE IRR 1000ML POUR (IV SOLUTION) ×2 IMPLANT

## 2021-02-09 NOTE — Progress Notes (Signed)
Patient comfortable with epidural.  RN reported earlier approx 12:30pm after cervical check unchanged, she had detailed conversation about cesarean section with patient.  My response was that patient was in latent labor and ruptured less than 12 hours so would not recommend proceeding with cesarean for arrest of dilation.  It is suspected based on very recent ultrasound that baby is expected to be large and that minimal descent is noted, but that continued trial of induction was warranted.  RN called to update me regarding progress.  She stated that continued minimal change and descent were noted and that patient was interested in proceeding with cesarean section.  I came to bedside to discuss with patient and reassess.  On my exam cervix was 4.5/80/-2,-3 and caput was noted.  Pt expressed concern that her mother had c/s with similar labor course and ended in emergency, that husband was close to 11# at birth and that she feels the baby has not descended and is large.  After approx 40hours of IOL she is electing to proceed with cesarean section.  I discussed with her risks/benefits/alternatives/complications of cesarean section and implications for future pregnancy.  I advised patient I would agree to proceed with primary cesarean section.  FHT 130 mod var +accels no decels, TOCO q1-3.  Advised will discontinue pitocin and order preoperative antibiotics.

## 2021-02-09 NOTE — Transfer of Care (Signed)
Immediate Anesthesia Transfer of Care Note  Patient: Kelli Craig  Procedure(s) Performed: CESAREAN SECTION  Patient Location: PACU  Anesthesia Type:Epidural  Level of Consciousness: awake, alert  and oriented  Airway & Oxygen Therapy: Patient Spontanous Breathing  Post-op Assessment: Report given to RN and Post -op Vital signs reviewed and stable  Post vital signs: Reviewed and stable  Last Vitals:  Vitals Value Taken Time  BP 111/77 02/09/21 2000  Temp    Pulse 99 02/09/21 2000  Resp 22 02/09/21 2000  SpO2 99 % 02/09/21 2000  Vitals shown include unvalidated device data.  Last Pain:  Vitals:   02/09/21 1720  TempSrc:   PainSc: 0-No pain         Complications: No notable events documented.

## 2021-02-09 NOTE — Op Note (Signed)
02/08/2021 - 02/09/2021  7:48 PM  PATIENT:  Kelli Craig  30 y.o. female  PRE-OPERATIVE DIAGNOSIS:  elective primary cesarean section with suspected large for gestational age, with arrest of dilation in latent labor, failed induction of labor at 40 hours  POST-OPERATIVE DIAGNOSIS:  same  PROCEDURE:  Procedure(s): CESAREAN SECTION (N/A)- low transverse  SURGEON:  Surgeon(s) and Role:    Claiborne Billings, [place]oh[place], DO - Primary  ANESTHESIA:   epidural  EBL:  501 mL   SPECIMEN:  none  DISPOSITION OF SPECIMEN:  N/A  COUNTS:  YES  FINDINGS: female infant in cephalic presentations, with APGARS 7/8 and wt pending.  Normal tubes and ovaries bilaterally.  DESCRIPTION OF PROCEDURE:   Patient was taken to the operating room where epidural anesthesia was found to be adequate.  She was prepped and draped in the normal sterile fashion in dorsal supine position with a leftward tilt.  A Pfannenstiel skin incision was made with the scalpel and carried down to underlying layer of fascia with bovie cautery.  The fascia was incised at the midline with a scalpel and extended laterally with mayo scissors.  Kocher clamps were placed at the superior aspect of the fascial incision and rectus muscles were dissected off bluntly and sharply.  The Kocher clamps were then placed at the inferior aspect of the fascial incision and again rectus muscles were dissected off bluntly and sharply.  The abdomen/pelvis was manually surveyed and an Pharmacologist was placed.   The vesico-uterine peritoneum was identified, tented, entered sharply with metzenbaum scissors and extended laterally, the bladder flap was developed digitally.  A low-transverse uterine incision was made and the amniotic sac was entered bluntly sharply.  The infants head was located and delivered without difficulty from the hysterotomy with use of fundal pressure, followed easily by the remainder of the infants body.  The infant was bulb-suctioned and  dried while delayed cord clamping was achieved.  The cord was then doubly clamped and cut and the infant was handed off to awaiting neonatology.  Cord blood was not collected due to being overlooked while examining bleeders at the hysterotomy. External massage of the uterus was performed with gentle traction on the umbilical cord to facilitated delivery of the placenta which was intact.  The uterus was cleared of all clot and debris and the uterine incision was re-approximated and closed with vicryl in a running locked fashion followed by a second layer of horizontal imbrication. Two add'l figures of 8 were placed with vicryl on the right lower aspect of the incision.  Good hemostasis was noted. The alexis self-retractor was removed, all fascial and muscular surfaces were examined and hemostatic.  The Peritoneum was re-approximated and closed with 3-0 monocryl in a running fashion.  Followed by two add'l running loops re-approximating the lowe aspect of the rectus muscles. Fascia was then re-approximated and closed with 0-vicryl in a running fashion.  Subcutaenous tissue was irrigated, dried and minimal use of bovie was needed for hemostasis.  Plain gut suture was then used to place 4 interrupted sutures to close subcutaneous dead-space. Skin was re-approximated and closed with 3-0 vicryl on Keith needle.  Patient tolerated the procedure well, sponge, lap and needle counts were correct x 2.  Patient was taken to recovery in stable condition.   PLAN OF CARE: Admit to inpatient   PATIENT DISPOSITION:  PACU - hemodynamically stable.   Delay start of Pharmacological VTE agent (>24hrs) due to surgical blood loss or risk of bleeding:  not applicable

## 2021-02-10 ENCOUNTER — Encounter (HOSPITAL_COMMUNITY): Payer: Self-pay | Admitting: Obstetrics and Gynecology

## 2021-02-10 LAB — CBC
HCT: 29.2 % — ABNORMAL LOW (ref 36.0–46.0)
Hemoglobin: 9.5 g/dL — ABNORMAL LOW (ref 12.0–15.0)
MCH: 28.5 pg (ref 26.0–34.0)
MCHC: 32.5 g/dL (ref 30.0–36.0)
MCV: 87.7 fL (ref 80.0–100.0)
Platelets: 188 10*3/uL (ref 150–400)
RBC: 3.33 MIL/uL — ABNORMAL LOW (ref 3.87–5.11)
RDW: 15.1 % (ref 11.5–15.5)
WBC: 20.4 10*3/uL — ABNORMAL HIGH (ref 4.0–10.5)
nRBC: 0 % (ref 0.0–0.2)

## 2021-02-10 MED ORDER — IBUPROFEN 600 MG PO TABS
600.0000 mg | ORAL_TABLET | Freq: Four times a day (QID) | ORAL | Status: DC
  Administered 2021-02-10 – 2021-02-11 (×6): 600 mg via ORAL
  Filled 2021-02-10 (×6): qty 1

## 2021-02-10 NOTE — Lactation Note (Addendum)
This note was copied from a baby's chart. Lactation Consultation Note  Patient Name: Kelli Craig [id number] Date: 02/10/2021 Reason for consult: Initial assessment Age:30 hours  P1, Mother reports that infant has not breastfed yet. She reports that infant with latch for a few moments and then pops off.  Infant is now under photo therapy treatment for jaundice.  Staff nurse assist mother with hand expression and spoon feeding. Mother has a hand pump she has used. Reviewed hand expression. Assist mother with pumping using a #24 flange. Unable to secure a  #21 flange at this time.  Infant spit a moderate amt of brown blood tinged fld.   Mother taught to firm her nipple piror to latching infant. Infant placed in football hold. Infant latched on and sustained latch for 20 mins.  Infant was spoon fed 1.5 ml .  .  Staff nurse to sat up DEBP  at the bedside. Mother know to pump after each feeding.  Encouraged mother to rouse infant with feeding cues and supplement infant with ebm after each feeding.  Mother receptive to all teaching .  Maternal Data Has patient been taught Hand Expression?: Yes Does the patient have breastfeeding experience prior to this delivery?: No  Feeding Mother's Current Feeding Choice: Breast Milk  LATCH Score Latch: Grasps breast easily, tongue down, lips flanged, rhythmical sucking.  Audible Swallowing: A few with stimulation  Type of Nipple: Flat  Comfort (Breast/Nipple): Soft / non-tender  Hold (Positioning): Assistance needed to correctly position infant at breast and maintain latch.  LATCH Score: 7   Lactation Tools Discussed/Used    Interventions Interventions: Breast feeding basics reviewed;Assisted with latch;Skin to skin;Hand express  Discharge Pump: Personal (Baby [place], Mom Cozy)  Consult Status      Stevan Born [facility] 02/10/2021, 10:38 AM

## 2021-02-10 NOTE — Progress Notes (Signed)
Patient is eating, ambulating, voiding.  Pain control is good.  Appropriate lochia, no complaints.  Vitals:   02/09/21 2117 02/09/21 2200 02/10/21 0152 02/10/21 0604  BP: 109/74 116/89  111/82  Pulse: 92 86  97  Resp:  20  20  Temp: 98.8 F (37.1 C) 98.3 F (36.8 C) 98.1 F (36.7 C) 98.4 F (36.9 C)  TempSrc:  Oral Oral Oral  SpO2: 98% 99% 97% 97%  Weight:      Height:        Fundus firm, abd soft nontender Inc: c/d/I Ext: no calf tenderness  Lab Results  Component Value Date   WBC 20.4 (H) 02/10/2021   HGB 9.5 (L) 02/10/2021   HCT 29.2 (L) 02/10/2021   MCV 87.7 02/10/2021   PLT 188 02/10/2021    --/--/O POS (07/22 0020)  A/P Post op day #1 Acute blood loss anemia, Hb 9.5, continue PNV with Fe Doing well. Baby with jaundice but doing well.  Routine care.    Kelli Craig

## 2021-02-10 NOTE — Anesthesia Postprocedure Evaluation (Signed)
Anesthesia Post Note  Patient: Kelli Craig  Procedure(s) Performed: CESAREAN SECTION     Patient location during evaluation: PACU Anesthesia Type: Epidural Level of consciousness: awake and alert and oriented Pain management: pain level controlled Vital Signs Assessment: post-procedure vital signs reviewed and stable Respiratory status: spontaneous breathing, nonlabored ventilation and respiratory function stable Cardiovascular status: blood pressure returned to baseline and stable Postop Assessment: no headache, no backache, epidural receding and no apparent nausea or vomiting Anesthetic complications: no   No notable events documented.  Last Vitals:  Vitals:   02/10/21 0152 02/10/21 0604  BP:  111/82  Pulse:  97  Resp:  20  Temp: 36.7 C 36.9 C  SpO2: 97% 97%    Last Pain:  Vitals:   02/10/21 0604  TempSrc: Oral  PainSc:    Pain Goal:                   Lannie Fields

## 2021-02-11 NOTE — Lactation Note (Signed)
This note was copied from a baby's chart. Lactation Consultation Note  Patient Name: Kelli Craig [id number] Date: 02/11/2021 Reason for consult: Follow-up assessment Age:30 hours  LC in to room for follow up. Infant is receiving double phototherapy upon arrival. Parents state infant has been feeding better and has better output too.  Mother reports latching infant as well as using donor milk.   LC reviewed volume consistent with infant's age. Discussed normal newborn behavior and patterns.  Reinforced the importance of pumping for stimulation and supplementation.   LC offered to assist with next feeding around 1900.    Feeding Mother's Current Feeding Choice: Breast Milk and Donor Milk  Lactation Tools Discussed/Used Tools: Pump;Flanges Flange Size: 24 Breast pump type: Double-Electric Breast Pump;Manual Pumping frequency: encouraged every 3h  Interventions Interventions: Breast feeding basics reviewed;Education;Expressed milk;Hand pump;DEBP;Coconut oil  Discharge Discharge Education: Engorgement and breast care Pump: Personal  Consult Status Consult Status: Follow-up Date: 02/11/21 Follow-up type: In-patient    Kelli Craig 02/11/2021, 5:51 PM

## 2021-02-11 NOTE — Progress Notes (Signed)
Subjective: Postpartum Day 2: Cesarean Delivery Kelli Craig is overall doing well. Reports overall "soreness" of lower back and abdomen that is exacerbated by walking and improves with PO analgesia. She is ambulating, voiding, and tolerating PO. Bilateral legs more swollen, no calf pain.   Baby under bili lights, planning for repeat labs in PM and tomorrow AM  Objective: Patient Vitals for the past 24 hrs:  BP Temp Temp src Pulse Resp SpO2  02/11/21 0614 122/78 98.3 F (36.8 C) Oral 93 18 98 %  02/10/21 2350 (!) 104/59 98.3 F (36.8 C) -- 84 -- 100 %  02/10/21 1645 (!) 104/55 98.4 F (36.9 C) Oral 92 18 --    Physical Exam:  General: alert, cooperative, and no distress Lochia: appropriate Uterine Fundus: firm Incision: healing well, no significant drainage, no dehiscence, no significant erythema DVT Evaluation: No evidence of DVT seen on physical exam.  Recent Labs    02/10/21 0429  HGB 9.5*  HCT 29.2*    Assessment/Plan:  Kelli Craig G1P0 POD#2 sp primary cesarean at [redacted]w[redacted]d, failed induction 1. PPC: continue routine postpartum care 2. Acute blood loss anemia: asymptomatic, continue PO iron 3. Rh positive, rubella immune. S/p tdap and covid vaccines prenatally 4. Dispo: anticipate d/c home tomorrow  Kelli Craig 02/11/2021, 2:32 PM

## 2021-02-11 NOTE — Lactation Note (Signed)
This note was copied from a baby's chart. Lactation Consultation Note  Patient Name: Kelli Craig [id number] Date: 02/11/2021 Reason for consult: Follow-up assessment;Hyperbilirubinemia Age:30 hours  Follow up 49 hours old infant. Parents are getting ready to latch infant. Noted positioning and latching challenges due to double phototherapy. With stimulation, infant started showing hunger cues. LC offered assistance with latch. Observed suckling and swallows with stimulation. Infant was breastfeeding for ~20 minutes. LC attempted at breast supplementation with 5"french tube and syringe, but unsuccessful.   LC paced bottlefed donor milk while parents rearranged phototherapy device. Provider called and order single phototherapy. Parents finish bottlefeeding donor milk. Discussed appropriate volume according age.  Encouraged mother to pump for stimulation and supplementation.   Feeding plan:  1. Breastfeed following hunger cues.  2. Keep infant awake during breastfeeding session: massaging breast, infant's hand/shoulder/feet 3. Offer breast 8 - 12 times in 24h period to establish good milk supply.   4. Pump or hand-express and offer EBM following guidelines, paced bottle feeding and fullness cues.   5. Supplement with donor milk as needed.  6. Monitor voids and stools as signs good intake 7. Encouraged maternal rest, hydration and food intake.  8. Contact Lactation Services or local resources for support, questions or concerns.    All questions answered at this time.    Maternal Data Has patient been taught Hand Expression?: Yes  Feeding Mother's Current Feeding Choice: Breast Milk and Donor Milk  LATCH Score Latch: Grasps breast easily, tongue down, lips flanged, rhythmical sucking.  Audible Swallowing: A few with stimulation  Type of Nipple: Everted at rest and after stimulation (short shafted nipples, pliable tissue)  Comfort (Breast/Nipple): Soft / non-tender  Hold  (Positioning): Assistance needed to correctly position infant at breast and maintain latch.  LATCH Score: 8   Lactation Tools Discussed/Used Tools: Pump;Bottle Flange Size: 24 Breast pump type: Double-Electric Breast Pump Pumping frequency: encouraged every 3h  Interventions Interventions: Assisted with latch;Hand express;DEBP;Adjust position;Breast compression;Expressed milk;Position options;Support pillows;Education;Breast feeding basics reviewed  Discharge Discharge Education: Engorgement and breast care Pump: DEBP WIC Program: No  Consult Status Consult Status: Follow-up Date: 02/12/21 Follow-up type: In-patient    Naszir Cott A Higuera Ancidey 02/11/2021, 8:24 PM

## 2021-02-12 ENCOUNTER — Ambulatory Visit: Payer: Self-pay

## 2021-02-12 MED ORDER — IBUPROFEN 100 MG/5ML PO SUSP
600.0000 mg | Freq: Four times a day (QID) | ORAL | Status: DC
  Administered 2021-02-12 (×2): 600 mg via ORAL
  Filled 2021-02-12 (×2): qty 30

## 2021-02-12 MED ORDER — OXYCODONE-ACETAMINOPHEN 5-325 MG PO TABS: 1.0000 | ORAL_TABLET | ORAL | 0 refills | Status: DC | PRN

## 2021-02-12 NOTE — Discharge Summary (Signed)
Postpartum Discharge Summary  Date of Service updated      Patient Name: Kelli Craig DOB: 1991/03/18 MRN: [id number]  Date of admission: 02/08/2021 Delivery date:02/09/2021  Delivering provider: Allyn Kenner  Date of discharge: 02/12/2021  Admitting diagnosis: Term pregnancy [Z34.90] Intrauterine pregnancy: [redacted]w[redacted]d    Secondary diagnosis:  Active Problems:   Term pregnancy  Additional problems: obesity    Discharge diagnosis: Term Pregnancy Delivered                                              Post partum procedures: none Augmentation: AROM, Pitocin, and Cytotec Complications: None  Hospital course: Induction of Labor With Cesarean Section   30y.o. yo G1P0 at 436w5das admitted to the hospital 02/08/2021 for induction of labor. Patient had a labor course significant for protracted labor. The patient went for cesarean section due to Arrest of Dilation. Delivery details are as follows: Membrane Rupture Time/Date: 8:17 AM ,02/09/2021   Delivery Method:C-Section, Low Transverse  Details of operation can be found in separate operative Note.  Patient had an uncomplicated postpartum course. She is ambulating, tolerating a regular diet, passing flatus, and urinating well.  Patient is discharged home in stable condition on 02/12/21.      Newborn Data: Birth date:02/09/2021  Birth time:6:47 PM  Gender:Female  Living status:Living  Apgars:7 ,8  Weight:3541 g                                Magnesium Sulfate received: No BMZ received: No Rhophylac:No MMR:No T-DaP:Given prenatally Flu: No Transfusion:No  Physical exam  Vitals:   02/11/21 0614 02/11/21 1446 02/11/21 2300 02/12/21 0545  BP: 122/78 122/69 135/87 115/67  Pulse: 93 (!) 105 (!) 101 89  Resp: _0 Temp: 98.3 F (36.8 C) 98.1 F (36.7 C) 98.2 F (36.8 C) 98.1 F (36.7 C)  TempSrc: Oral Oral Oral Oral  SpO2: 98% 100% 100% 100%  Weight:      Height:        Labs: Lab Results  Component Value  Date   WBC 20.4 (H) 02/10/2021   HGB 9.5 (L) 02/10/2021   HCT 29.2 (L) 02/10/2021   MCV 87.7 02/10/2021   PLT 188 02/10/2021   CMP Latest Ref Rng & Units 07/16/2020  Glucose 70 - 99 mg/dL 115(H)  BUN 6 - 20 mg/dL 6  Creatinine 0.44 - 1.00 mg/dL 0.61  Sodium 135 - 145 mmol/L 137  Potassium 3.5 - 5.1 mmol/L 3.5  Chloride 98 - 111 mmol/L 107  CO2 22 - 32 mmol/L 20(L)  Calcium 8.9 - 10.3 mg/dL 9.3  Total Protein 6.5 - 8.1 g/dL 7.1  Total Bilirubin 0.3 - 1.2 mg/dL 0.4  Alkaline Phos 38 - 126 U/L 73  AST 15 - 41 U/L 17  ALT 0 - 44 U/L 17   Edinburgh Score: Edinburgh Postnatal Depression Scale Screening Tool 02/10/2021  I have been able to laugh and see the funny side of things. 0  I have looked forward with enjoyment to things. 0  I have blamed myself unnecessarily when things went wrong. 0  I have been anxious or worried for no good reason. 1  I have felt scared or panicky for no good reason. 1  Things have been getting  on top of me. 0  I have been so unhappy that I have had difficulty sleeping. 0  I have felt sad or miserable. 0  I have been so unhappy that I have been crying. 0  The thought of harming myself has occurred to me. 0  Edinburgh Postnatal Depression Scale Total 2      After visit meds:  Allergies as of 02/12/2021   No Known Allergies      Medication List     STOP taking these medications    aspirin 81 MG chewable tablet   PRESCRIPTION MEDICATION   Sronyx 0.1-20 MG-MCG tablet Generic drug: levonorgestrel-ethinyl estradiol       TAKE these medications    escitalopram 10 MG tablet Commonly known as: Lexapro Take 1 tablet (10 mg total) by mouth daily.   multivitamin-prenatal 27-0.8 MG Tabs tablet Take 1 tablet by mouth daily at 12 noon.   oxyCODONE-acetaminophen 5-325 MG tablet Commonly known as: PERCOCET/ROXICET Take 1 tablet by mouth every 4 (four) hours as needed for severe pain.         Discharge home in stable condition Infant  Feeding:  ? Infant Disposition:rooming in Discharge instruction: per After Visit Summary and Postpartum booklet. Activity: Advance as tolerated. Pelvic rest for 6 weeks.  Diet: routine diet Anticipated Birth Control: Unsure Postpartum Appointment:4 weeks Additional Postpartum F/U:  none Future Appointments:No future appointments. Follow up Visit:  Follow-up Information     Allyn Kenner, DO Follow up in 4 week(s).   Specialty: Obstetrics and Gynecology Contact information: [address] [place] [address] [phone]                     02/12/2021 Daria Pastures, MD

## 2021-02-12 NOTE — Lactation Note (Addendum)
This note was copied from a baby's chart. Lactation Consultation Note  Patient Name: Kelli Craig MVHQI'[id number] Date: 02/12/2021 Reason for consult: Follow-up assessment;Mother's request;Difficult latch;Term;Hyperbilirubinemia;Infant weight loss Age:30 hours  Dad pace bottle feeding DBM with yellow slow flow nipple. Infant had 30 ml at last feeding at 7 pm. LC reviewed with parents if infant not latching at breast for every feeding they can offer more as tolerated.  Mom soreness right more so than left. LC provided comfort gels for care. RN to provided coconut oil to use prior to pumping. Bruise on right nipple compared to left. LC also reviewed use of breast shells to help elongate her nipple. LC set up parts and reviewed usage, cleaning and assembly.  Mother aware to not use when she is pumping, sleeping or nursing.   Mom pumped 2x today getting drops of colostrum. LC talked with Mom on importance to pump consistently after latching q 3 hrs for 15 to maintain her milk supply. LC assessed flange size Mom using 24 with areola pain, we tried on 21 she had and stated better fit. Mom pump more consistently given flanges fit more comfortable.   Plan 1. To feed based on cues 8-12x in 24 hr period no more than 4 hrs without an attempt. Mom to offer breast first and look fir signs of milk transfer.  2. Dad to supplement offering any EBM first via spoon then DBM via pace bottle feeding 40 ml or more advance as tolerated.  3. Mom to pump with DEBP q 3 hrs for 15 min  All questions answered at the end of the visit.   Maternal Data    Feeding Mother's Current Feeding Choice: Breast Milk and Donor Milk Nipple Type: Slow - flow  LATCH Score                    Lactation Tools Discussed/Used Tools: Flanges;Pump;Coconut oil;Comfort gels;Shells (Mom complaining of soreness with right nipple. LC provided comfort gels. Mom aware to rinse in between use and discard after 6 days. Mom not to use  them with coconut oil) Flange Size: 21 Breast pump type: Double-Electric Breast Pump Pump Education: Setup, frequency, and cleaning;Milk Storage Reason for Pumping: increase stimulation Pumping frequency: every 3 hrs for 15 min  Interventions Interventions: Breast feeding basics reviewed;Support pillows;Education;Skin to skin;Coconut oil;Hand express;Breast compression;DEBP;Comfort gels  Discharge Pump: Personal;Manual  Consult Status Consult Status: Follow-up Date: 02/13/21 Follow-up type: In-patient    Kelli Slezak  Craig 02/12/2021, 10:58 PM

## 2021-02-12 NOTE — Progress Notes (Signed)
  Patient is eating, ambulating, voiding.  Pain control is good.  Vitals:   02/11/21 0614 02/11/21 1446 02/11/21 2300 02/12/21 0545  BP: 122/78 122/69 135/87 115/67  Pulse: 93 (!) 105 (!) 101 89  Resp: 18 18 18 18   Temp: 98.3 F (36.8 C) 98.1 F (36.7 C) 98.2 F (36.8 C) 98.1 F (36.7 C)  TempSrc: Oral Oral Oral Oral  SpO2: 98% 100% 100% 100%  Weight:      Height:        lungs:   clear to auscultation cor:    RRR Abdomen:  soft, appropriate tenderness, incisions intact and without erythema or exudate ex:    no cords   Lab Results  Component Value Date   WBC 20.4 (H) 02/10/2021   HGB 9.5 (L) 02/10/2021   HCT 29.2 (L) 02/10/2021   MCV 87.7 02/10/2021   PLT 188 02/10/2021    --/--/O POS (07/22 0020)/RI  A/P    Post operative day 3.  Routine post op and postpartum care.  Expect d/c today.  Percocet for pain control.

## 2021-02-13 ENCOUNTER — Ambulatory Visit: Payer: Self-pay

## 2021-02-13 NOTE — Lactation Note (Signed)
This note was copied from a baby's chart. Lactation Consultation Note  Patient Name: Kelli Craig [id number] Date: 02/13/2021 Reason for consult: Follow-up assessment;Primapara;1st time breastfeeding;Other (Comment) (weight gain of 70 gm / serum bili increased - low intermediate ,) Age:30 days LC reviewed the doc flow sheets with mom and dad, last fed at 0730 15 ml.  The doctor into exam baby and to discuss D/C .  LC steps out and returned after the doctor finished. Baby showing feeding cues . LC offered to assist to latch and mom receptive.  LC mote areola edema / semi compressible. Baby latches shallow and unable to sustain a deep latch. ( See doc flow sheets )  LC fitted mom for a NS #20 and it fit the best and she was able to return demo.  LC recommended wear the breast shells while awake between feedings.  Pre pump prior to latching with hand pump and if baby unable to sustain a latch , use the #20 NS and instilllEBM in the top.  Feed for 15 -20 mins - and supplement with at least 30 ml after the feeding and post pump both breast for 15 mins . / save milk for the next feeding.  See D/C teaching below.  LC recommended checking to see if the [facility] in the [facility] office is available . If not she has the [facility] brochure with resources.   Maternal Data    Feeding Mother's Current Feeding Choice: Breast Milk and Donor Milk Nipple Type: Extra Slow Flow  LATCH Score                    Lactation Tools Discussed/Used Tools: Pump;Shells;Comfort gels Flange Size: 21 (per mom the #21 flange comfortable) Breast pump type: Double-Electric Breast Pump Pumped volume: 50 mL  Interventions Interventions: Breast feeding basics reviewed;Education;Shells;Comfort gels;Coconut oil;DEBP  Discharge    Consult Status Consult Status: Follow-up Date: 02/13/21 Follow-up type: In-patient    Kelli Craig 02/13/2021, 8:52 AM

## 2021-02-20 ENCOUNTER — Telehealth (HOSPITAL_COMMUNITY): Payer: Self-pay | Admitting: *Deleted

## 2021-02-20 NOTE — Telephone Encounter (Signed)
Hospital discharge follow-up call attempted. No answer received. Deforest Hoyles, RN, 02/20/21, [phone].

## 2022-02-25 ENCOUNTER — Encounter (HOSPITAL_BASED_OUTPATIENT_CLINIC_OR_DEPARTMENT_OTHER): Payer: Self-pay | Admitting: Family Medicine

## 2022-02-25 ENCOUNTER — Ambulatory Visit (INDEPENDENT_AMBULATORY_CARE_PROVIDER_SITE_OTHER): Payer: BC Managed Care – PPO | Admitting: Family Medicine

## 2022-02-25 DIAGNOSIS — Z7689 Persons encountering health services in other specified circumstances: Secondary | ICD-10-CM

## 2022-02-25 DIAGNOSIS — E669 Obesity, unspecified: Secondary | ICD-10-CM

## 2022-02-25 DIAGNOSIS — Z0000 Encounter for general adult medical examination without abnormal findings: Secondary | ICD-10-CM

## 2022-02-25 DIAGNOSIS — E66812 Obesity, class 2: Secondary | ICD-10-CM | POA: Insufficient documentation

## 2022-02-25 HISTORY — DX: Persons encountering health services in other specified circumstances: Z76.89

## 2022-02-25 NOTE — Assessment & Plan Note (Signed)
Patient today is in relatively good health.  She has not had a PCP in some time now, we will arrange to have physical completed in the next 1 to 2 months and plan to complete baseline labs in conjunction with that upcoming visit.  She will schedule his appointments today Did discuss general recommendations related to lifestyle modifications and physical activity

## 2022-02-25 NOTE — Patient Instructions (Signed)
  Medication Instructions:  Your physician recommends that you continue on your current medications as directed. Please refer to the Current Medication list given to you today. --If you need a refill on any your medications before your next appointment, please call your pharmacy first. If no refills are authorized on file call the office.-- Lab Work: Your physician has recommended that you have lab work today: 1 week prior to CPE If you have labs (blood work) drawn today and your tests are completely normal, you will receive your results via MyChart message OR a phone call from our staff.  Please ensure you check your voicemail in the event that you authorized detailed messages to be left on a delegated number. If you have any lab test that is abnormal or we need to change your treatment, we will call you to review the results.   Follow-Up: Your next appointment:   Your physician recommends that you schedule a follow-up appointment in: 1-2 month CPE with Dr. de [place]  You will receive a text message or e-mail with a link to a survey about your care and experience with [place] today! We would greatly appreciate your feedback!   Thanks for letting us be apart of your health journey!!  Primary Care and Sports Medicine   Dr. Ceasar Mons [place]   We encourage you to activate your patient portal called "MyChart".  Sign up information is provided on this After Visit Summary.  MyChart is used to connect with patients for Virtual Visits (Telemedicine).  Patients are able to view lab/test results, encounter notes, upcoming appointments, etc.  Non-urgent messages can be sent to your provider as well. To learn more about what you can do with MyChart, please visit --  [email or web address].

## 2022-02-25 NOTE — Assessment & Plan Note (Signed)
Today we discussed potential evaluation and management.  We we will plan to complete baseline labs with upcoming physical in about 1 to 2 months Did discuss possibility of referral to a nutritionist/dietitian for possible referral to the healthy weight and wellness clinic.  She would prefer to hold off until we can complete labs and discuss this further and then make decisions for his next steps and consideration for above-mentioned referrals

## 2022-02-25 NOTE — Progress Notes (Signed)
New Patient Office Visit  Subjective    Patient ID: Kelli Craig, female    DOB: July 03, 1991  Age: 31 y.o. MRN: [id number]  CC:  Chief Complaint  Patient presents with   New Patient (Initial Visit)    Patient presents today to establish care.     HPI Kelli Craig presents to establish care Last PCP - has been a little awhile, has been following with OBGYN related to recent pregnancy.  Had delivery about 1 year ago. Denies any complications related to the pregnancy. Denies Fhx of BP or blood sugar issues. Does have Fhx of Hashimoto's in mother  Has concerns regarding weight today. Has generally been trying to watch what she is eating, striving to have a caloric deficit. Weight gain mostly noted since prior to her pregnancy and has been extending since her pregnancy. Has been active with walking primarily.  Patient is originally from [place]. She works in Pharmacologist, in [employer]. Outside of work, she takes care of her child, cleaning, cooking.  Outpatient Encounter Medications as of 02/25/2022  Medication Sig   Prenatal Vit-Fe Fumarate-FA (MULTIVITAMIN-PRENATAL) 27-0.8 MG TABS tablet Take 1 tablet by mouth daily at 12 noon.   [DISCONTINUED] escitalopram (LEXAPRO) 10 MG tablet Take 1 tablet (10 mg total) by mouth daily.   [DISCONTINUED] oxyCODONE-acetaminophen (PERCOCET/ROXICET) 5-325 MG tablet Take 1 tablet by mouth every 4 (four) hours as needed for severe pain.   No facility-administered encounter medications on file as of 02/25/2022.    Past Medical History:  Diagnosis Date   Tachycardia     Past Surgical History:  Procedure Laterality Date   CESAREAN SECTION N/A 02/09/2021   Procedure: CESAREAN SECTION;  Surgeon: Philip Aspen, DO;  Location: [facility] LD ORS;  Service: Obstetrics;  Laterality: N/A;    Family History  Problem Relation Age of Onset   Hashimoto's thyroiditis Mother    Cancer Maternal Aunt 95   Cancer Paternal Aunt 75    Social History   Socioeconomic  History   Marital status: Married    Spouse name: Optometrist   Number of children: 1   Years of education: Not on file   Highest education level: Not on file  Occupational History   Not on file  Tobacco Use   Smoking status: Former    Types: Cigarettes    Quit date: 07/21/2017    Years since quitting: 4.6   Smokeless tobacco: Never   Tobacco comments:    pt states she only smokes when she drinks alcohol  Substance and Sexual Activity   Alcohol use: Yes    Alcohol/week: 0.0 standard drinks of alcohol    Comment: maybe once a month per pt   Drug use: No   Sexual activity: Yes    Partners: Male    Birth control/protection: None  Other Topics Concern   Not on file  Social History Narrative   Not on file   Social Determinants of Health   Financial Resource Strain: Not on file  Food Insecurity: Not on file  Transportation Needs: Not on file  Physical Activity: Not on file  Stress: Not on file  Social Connections: Not on file  Intimate Partner Violence: Not on file    Objective    BP 125/89   Pulse 97   Ht 5\' 2"  (1.575 m)   Wt 203 lb (92.1 kg)   SpO2 99%   BMI 37.13 kg/m   Physical Exam  31 year old female in no acute distress Cardiovascular exam  regular rate and rhythm, no murmur appreciated Lungs clear to auscultation bilaterally  Assessment & Plan:   Problem List Items Addressed This Visit       Other   Encounter to establish care    Patient today is in relatively good health.  She has not had a PCP in some time now, we will arrange to have physical completed in the next 1 to 2 months and plan to complete baseline labs in conjunction with that upcoming visit.  She will schedule his appointments today Did discuss general recommendations related to lifestyle modifications and physical activity      Obesity, Class II, BMI 35-39.9    Today we discussed potential evaluation and management.  We we will plan to complete baseline labs with upcoming physical in about 1  to 2 months Did discuss possibility of referral to a nutritionist/dietitian for possible referral to the healthy weight and wellness clinic.  She would prefer to hold off until we can complete labs and discuss this further and then make decisions for his next steps and consideration for above-mentioned referrals      Other Visit Diagnoses     Wellness examination       Relevant Orders   CBC with Differential/Platelet   Comprehensive metabolic panel   Hemoglobin A1c   Lipid panel   TSH Rfx on Abnormal to Free T4       Return in about 6 weeks (around 04/08/2022) for CPE with [facility] a few days prior.   Annalia Metzger J De [place], MD

## 2022-03-21 IMAGING — MR MR MRV HEAD W/O CM
2 series · 20 of 48 positions shown · non-contrast
Comparison: None.

CLINICAL DATA: Monocular vision loss

EXAM:
MR VENOGRAM OF THE HEAD WITHOUT CONTRAST
TECHNIQUE: Angiographic images of the intracranial venous structures were
obtained using MRV technique without intravenous contrast.

[Series 2: MRV · coronal · 1.5mm · 0.43mm/px · 9 of 123 slices shown]
[im 1/123]
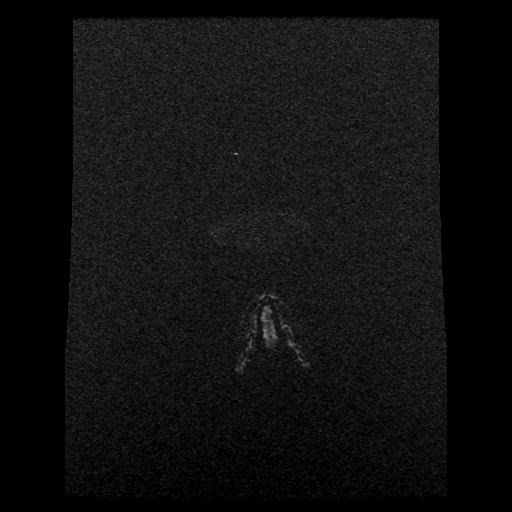
[im 21/123]
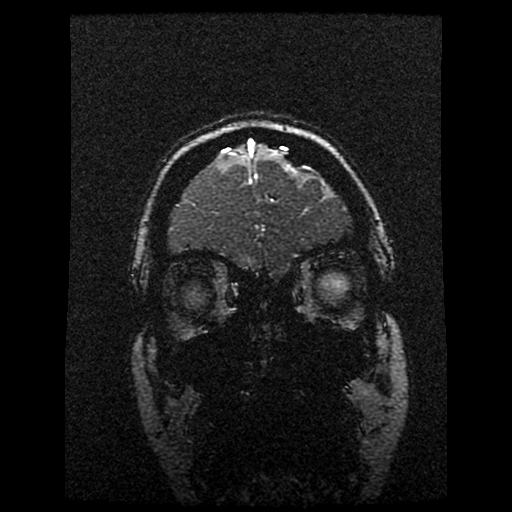
[im 41/123]
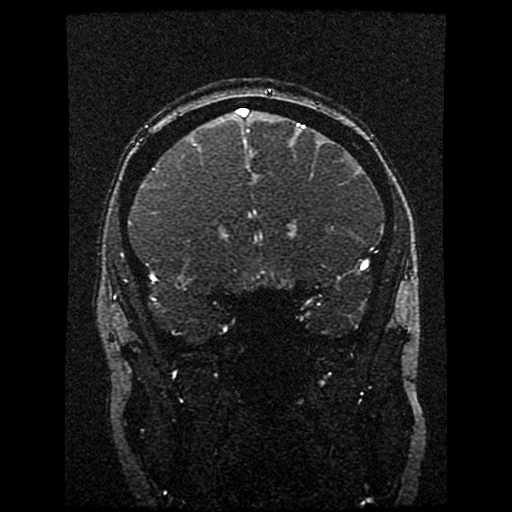
[im 51/123]
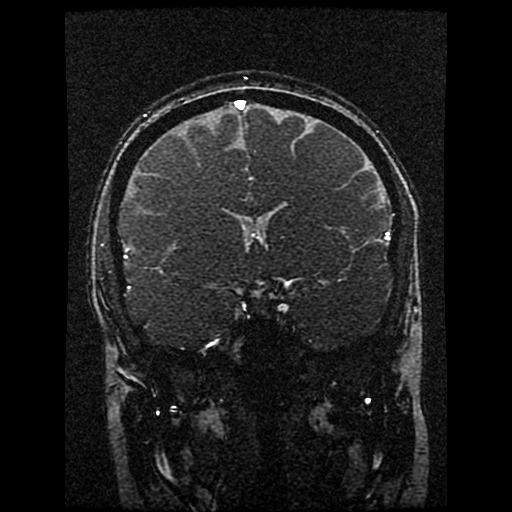
[im 62/123]
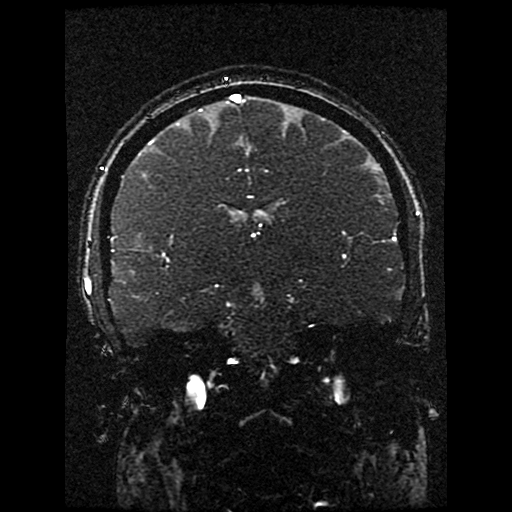
[im 72/123]
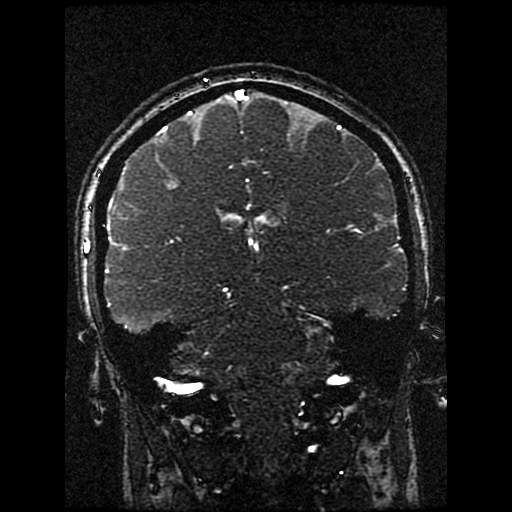
[im 82/123]
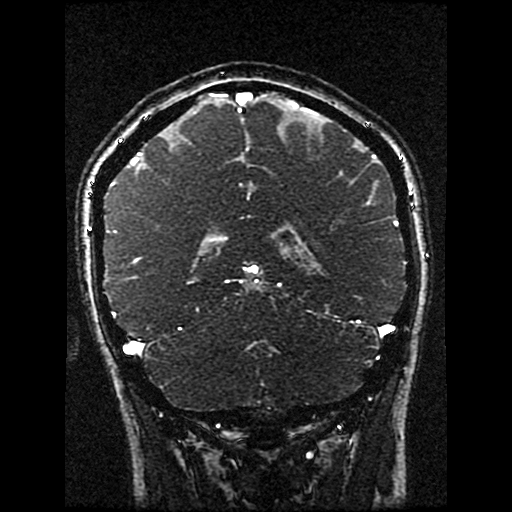
[im 102/123]
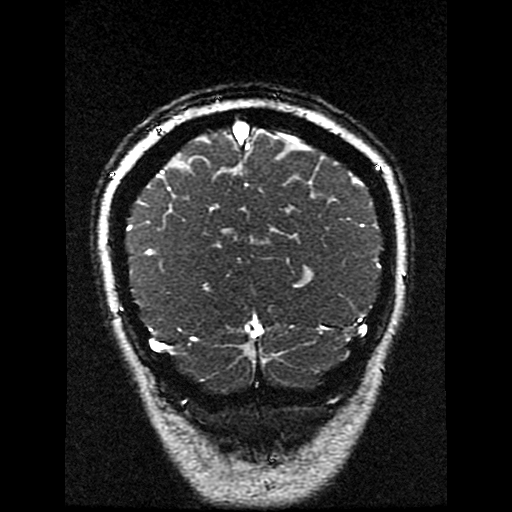
[im 123/123]
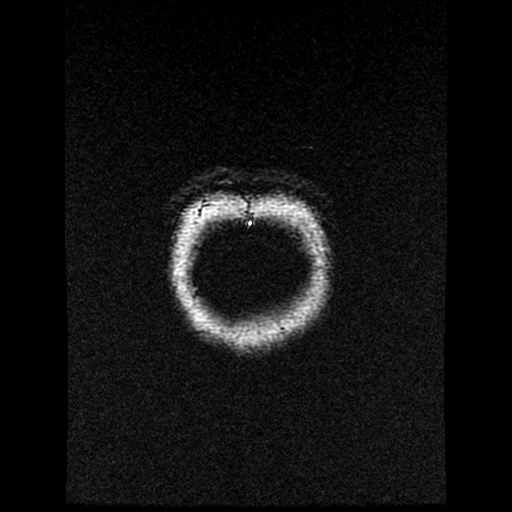

[Series 3: sag inhance (id) · sagittal · 1.8mm · 0.47mm/px · 11 of 329 slices shown]
[im 20/329]
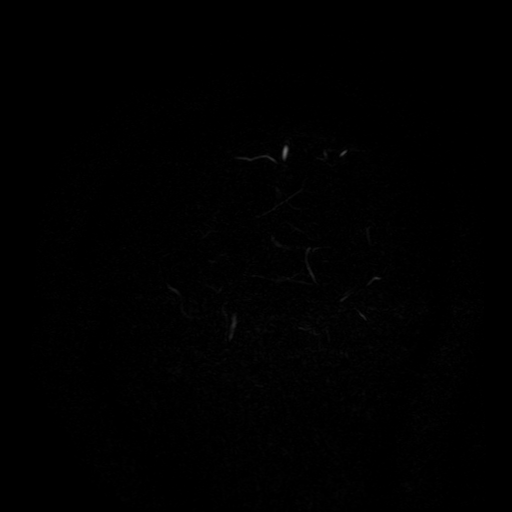
[im 49/329]
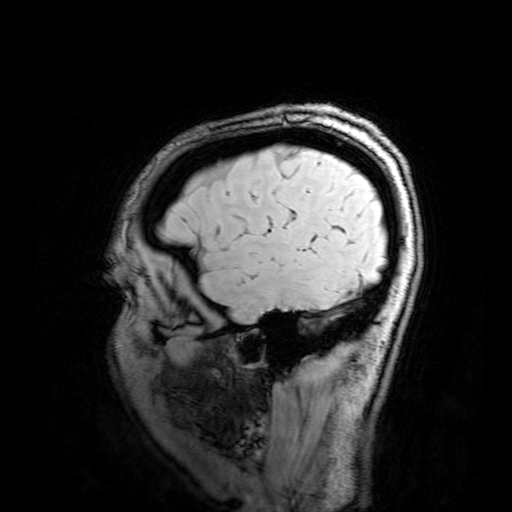
[im 58/329]
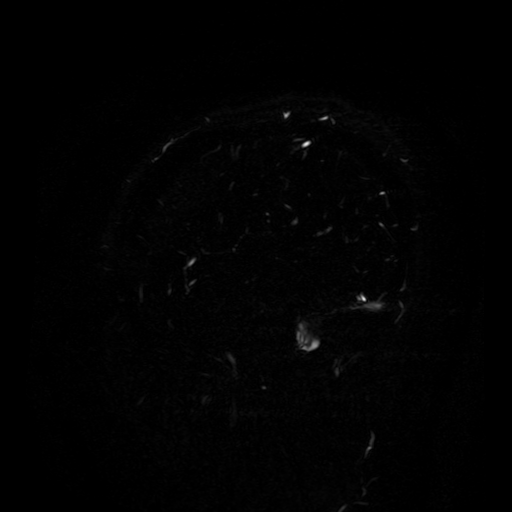
[im 97/329]
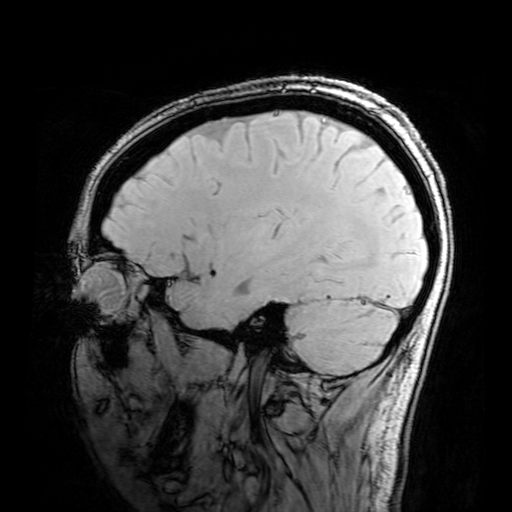
[im 145/329]
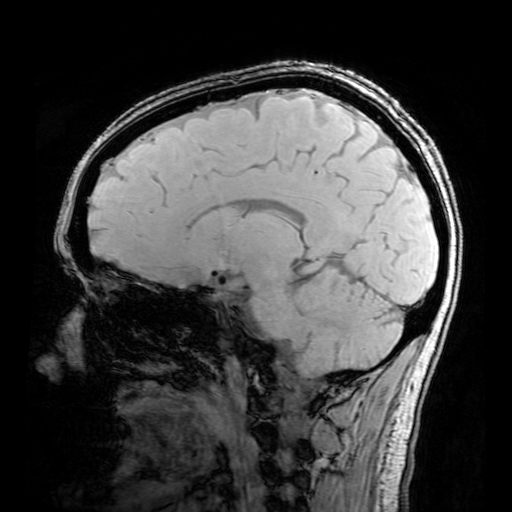
[im 165/329]
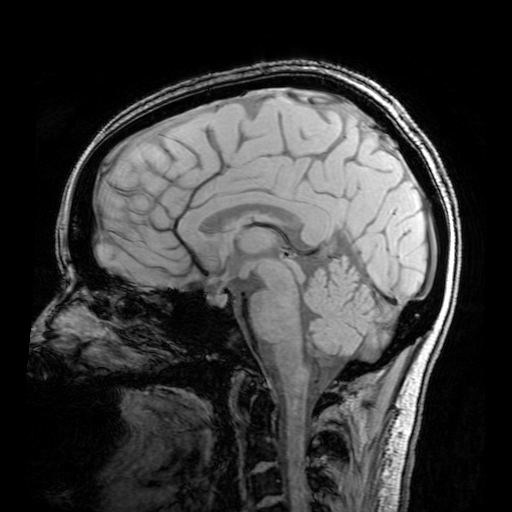
[im 184/329]
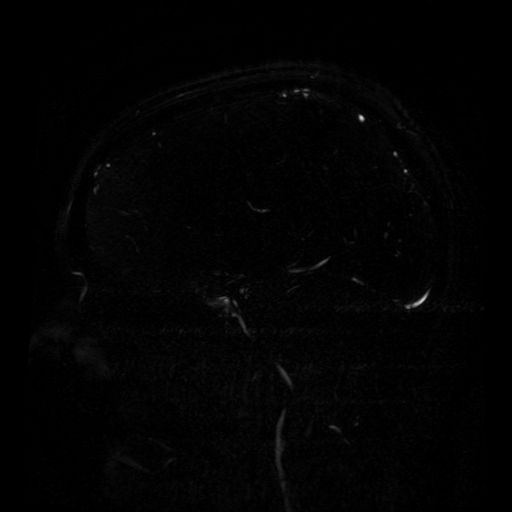
[im 232/329]
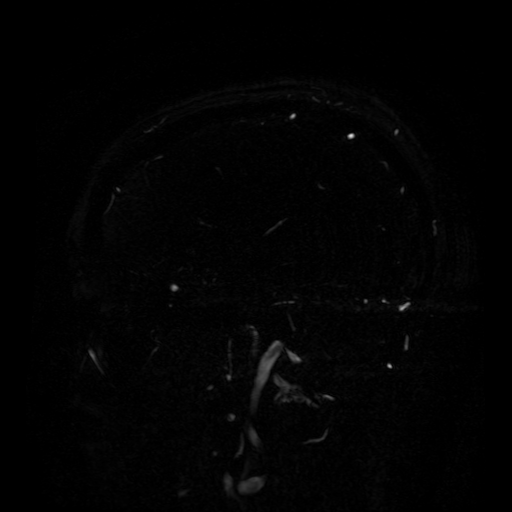
[im 271/329]
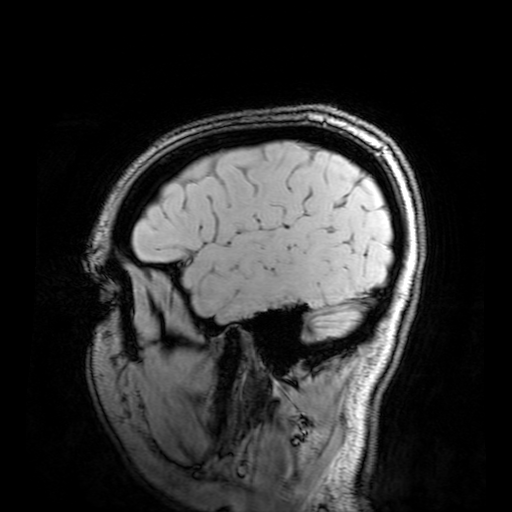
[im 280/329]
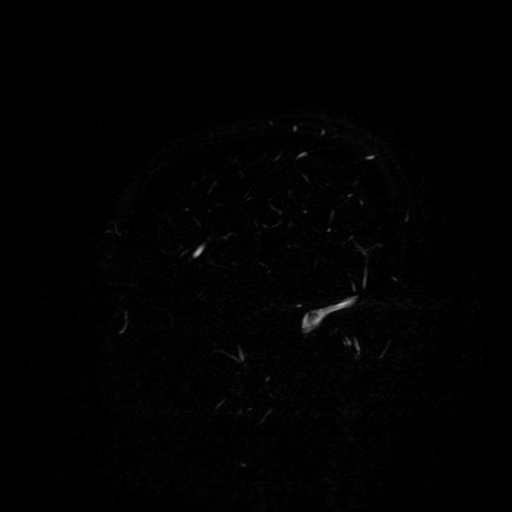
[im 309/329]
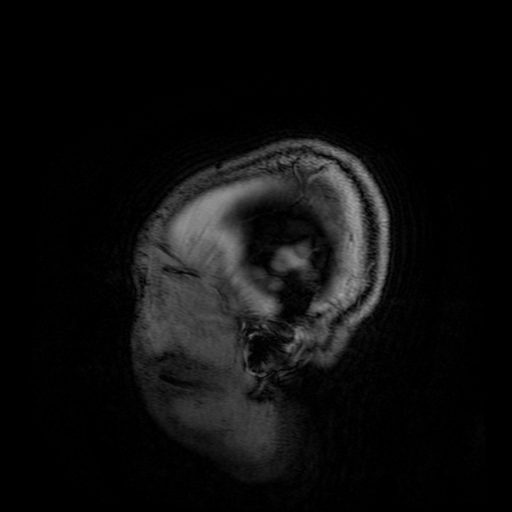

[20 of 48 positions shown; findings below may reference images not displayed]

FINDINGS: Superior sagittal sinus: Normal.

Straight sinus: Normal.

Inferior sagittal sinus, vein of [place] and internal cerebral veins:
Normal.

Transverse sinuses: Diminutive left transverse sinus is likely a
normal variant. Normal right transverse sinus.

Sigmoid sinuses: Normal.

Visualized jugular veins: Normal.
IMPRESSION: No venous sinus thrombosis.

## 2022-03-31 ENCOUNTER — Encounter (HOSPITAL_BASED_OUTPATIENT_CLINIC_OR_DEPARTMENT_OTHER): Payer: Self-pay | Admitting: Family Medicine

## 2022-04-03 ENCOUNTER — Ambulatory Visit (HOSPITAL_BASED_OUTPATIENT_CLINIC_OR_DEPARTMENT_OTHER): Payer: BC Managed Care – PPO

## 2022-04-03 ENCOUNTER — Encounter (HOSPITAL_BASED_OUTPATIENT_CLINIC_OR_DEPARTMENT_OTHER): Payer: Self-pay

## 2022-04-03 DIAGNOSIS — Z0000 Encounter for general adult medical examination without abnormal findings: Secondary | ICD-10-CM | POA: Diagnosis not present

## 2022-04-04 LAB — CBC WITH DIFFERENTIAL/PLATELET
Basophils Absolute: 0 10*3/uL (ref 0.0–0.2)
Basos: 0 %
EOS (ABSOLUTE): 0.2 10*3/uL (ref 0.0–0.4)
Eos: 2 %
Hematocrit: 45.5 % (ref 34.0–46.6)
Hemoglobin: 14.9 g/dL (ref 11.1–15.9)
Immature Grans (Abs): 0 10*3/uL (ref 0.0–0.1)
Immature Granulocytes: 0 %
Lymphocytes Absolute: 2.3 10*3/uL (ref 0.7–3.1)
Lymphs: 25 %
MCH: 29.9 pg (ref 26.6–33.0)
MCHC: 32.7 g/dL (ref 31.5–35.7)
MCV: 91 fL (ref 79–97)
Monocytes Absolute: 0.5 10*3/uL (ref 0.1–0.9)
Monocytes: 6 %
Neutrophils Absolute: 6.1 10*3/uL (ref 1.4–7.0)
Neutrophils: 67 %
Platelets: 224 10*3/uL (ref 150–450)
RBC: 4.99 x10E6/uL (ref 3.77–5.28)
RDW: 12.5 % (ref 11.7–15.4)
WBC: 9.2 10*3/uL (ref 3.4–10.8)

## 2022-04-04 LAB — HEMOGLOBIN A1C
Est. average glucose Bld gHb Est-mCnc: 103 mg/dL
Hgb A1c MFr Bld: 5.2 % (ref 4.8–5.6)

## 2022-04-04 LAB — COMPREHENSIVE METABOLIC PANEL
ALT: 12 IU/L (ref 0–32)
AST: 13 IU/L (ref 0–40)
Albumin/Globulin Ratio: 1.5 (ref 1.2–2.2)
Albumin: 4.4 g/dL (ref 4.0–5.0)
Alkaline Phosphatase: 133 IU/L — ABNORMAL HIGH (ref 44–121)
BUN/Creatinine Ratio: 12 (ref 9–23)
BUN: 10 mg/dL (ref 6–20)
Bilirubin Total: 0.3 mg/dL (ref 0.0–1.2)
CO2: 19 mmol/L — ABNORMAL LOW (ref 20–29)
Calcium: 9.7 mg/dL (ref 8.7–10.2)
Chloride: 105 mmol/L (ref 96–106)
Creatinine, Ser: 0.81 mg/dL (ref 0.57–1.00)
Globulin, Total: 3 g/dL (ref 1.5–4.5)
Glucose: 98 mg/dL (ref 70–99)
Potassium: 5.1 mmol/L (ref 3.5–5.2)
Sodium: 142 mmol/L (ref 134–144)
Total Protein: 7.4 g/dL (ref 6.0–8.5)
eGFR: 100 mL/min/{1.73_m2} (ref >59)

## 2022-04-04 LAB — LIPID PANEL
Chol/HDL Ratio: 2.7 ratio (ref 0.0–4.4)
Cholesterol, Total: 200 mg/dL — ABNORMAL HIGH (ref 100–199)
HDL: 73 mg/dL (ref >39)
LDL Chol Calc (NIH): 107 mg/dL — ABNORMAL HIGH (ref 0–99)
Triglycerides: 114 mg/dL (ref 0–149)
VLDL Cholesterol Cal: 20 mg/dL (ref 5–40)

## 2022-04-04 LAB — TSH RFX ON ABNORMAL TO FREE T4: TSH: 3.78 u[IU]/mL (ref 0.450–4.500)

## 2022-04-09 ENCOUNTER — Encounter (HOSPITAL_BASED_OUTPATIENT_CLINIC_OR_DEPARTMENT_OTHER): Payer: BC Managed Care – PPO | Admitting: Family Medicine

## 2022-04-22 DIAGNOSIS — Z6838 Body mass index (BMI) 38.0-38.9, adult: Secondary | ICD-10-CM | POA: Diagnosis not present

## 2022-04-22 DIAGNOSIS — Z01419 Encounter for gynecological examination (general) (routine) without abnormal findings: Secondary | ICD-10-CM | POA: Diagnosis not present

## 2022-04-24 ENCOUNTER — Encounter (HOSPITAL_BASED_OUTPATIENT_CLINIC_OR_DEPARTMENT_OTHER): Payer: Self-pay | Admitting: Family Medicine

## 2022-04-24 ENCOUNTER — Ambulatory Visit (INDEPENDENT_AMBULATORY_CARE_PROVIDER_SITE_OTHER): Payer: BC Managed Care – PPO | Admitting: Family Medicine

## 2022-04-24 VITALS — BP 129/84 | HR 106 | Temp 97.7°F | Ht 62.0 in | Wt 205.4 lb

## 2022-04-24 DIAGNOSIS — R109 Unspecified abdominal pain: Secondary | ICD-10-CM | POA: Insufficient documentation

## 2022-04-24 DIAGNOSIS — Z23 Encounter for immunization: Secondary | ICD-10-CM | POA: Diagnosis not present

## 2022-04-24 DIAGNOSIS — Z0000 Encounter for general adult medical examination without abnormal findings: Secondary | ICD-10-CM | POA: Insufficient documentation

## 2022-04-24 HISTORY — DX: Encounter for general adult medical examination without abnormal findings: Z00.00

## 2022-04-24 HISTORY — DX: Unspecified abdominal pain: R10.9

## 2022-04-24 NOTE — Assessment & Plan Note (Addendum)
Routine HCM labs reviewed. HCM reviewed/discussed. Anticipatory guidance regarding healthy weight, lifestyle and choices given. Recommend healthy diet.  Recommend approximately 150 minutes/week of moderate intensity exercise Recommend regular dental and vision exams Always use seatbelt/lap and shoulder restraints Recommend using smoke alarms and checking batteries at least twice a year Recommend using sunscreen when outside Discussed tetanus immunization recommendations, patient agreed to proceed with this today Recommend seasonal flu vaccine, amenable to receiving, administered today

## 2022-04-24 NOTE — Patient Instructions (Signed)
  Medication Instructions:  Your physician recommends that you continue on your current medications as directed. Please refer to the Current Medication list given to you today. --If you need a refill on any your medications before your next appointment, please call your pharmacy first. If no refills are authorized on file call the office.-- Lab Work: Your physician has recommended that you have lab work today: No If you have labs (blood work) drawn today and your tests are completely normal, you will receive your results via MyChart message OR a phone call from our staff.  Please ensure you check your voicemail in the event that you authorized detailed messages to be left on a delegated number. If you have any lab test that is abnormal or we need to change your treatment, we will call you to review the results.  Referrals/Procedures/Imaging: No  Follow-Up: Your next appointment:   Your physician recommends that you schedule a follow-up appointment in: 1 year cpe with Dr. de [place].  You will receive a text message or e-mail with a link to a survey about your care and experience with us today! We would greatly appreciate your feedback!   Thanks for letting us be apart of your health journey!!  Primary Care and Sports Medicine   Dr. Raymond de [place]   We encourage you to activate your patient portal called "MyChart".  Sign up information is provided on this After Visit Summary.  MyChart is used to connect with patients for Virtual Visits (Telemedicine).  Patients are able to view lab/test results, encounter notes, upcoming appointments, etc.  Non-urgent messages can be sent to your provider as well. To learn more about what you can do with MyChart, please visit --  [email or web address].    

## 2022-04-24 NOTE — Progress Notes (Signed)
Subjective:    CC: Annual Physical Exam  HPI:  Kelli Craig is a 31 y.o. presenting for annual physical  I reviewed the past medical history, family history, social history, surgical history, and allergies today and no changes were needed.  Please see the problem list section below in epic for further details.  Past Medical History: Past Medical History:  Diagnosis Date   Tachycardia    Past Surgical History: Past Surgical History:  Procedure Laterality Date   CESAREAN SECTION N/A 02/09/2021   Procedure: CESAREAN SECTION;  Surgeon: Allyn Kenner, DO;  Location: [facility];  Service: Obstetrics;  Laterality: N/A;   Social History: Social History   Socioeconomic History   Marital status: Married    Spouse name: Bill   Number of children: 1   Years of education: Not on file   Highest education level: Not on file  Occupational History   Not on file  Tobacco Use   Smoking status: Former    Types: Cigarettes    Quit date: 07/21/2017    Years since quitting: 4.7   Smokeless tobacco: Never   Tobacco comments:    pt states she only smokes when she drinks alcohol  Substance and Sexual Activity   Alcohol use: Yes    Alcohol/week: 0.0 standard drinks of alcohol    Comment: maybe once a month per pt   Drug use: No   Sexual activity: Yes    Partners: Male    Birth control/protection: None  Other Topics Concern   Not on file  Social History Narrative   Not on file   Social Determinants of Health   Financial Resource Strain: Not on file  Food Insecurity: Not on file  Transportation Needs: Not on file  Physical Activity: Not on file  Stress: Not on file  Social Connections: Not on file   Family History: Family History  Problem Relation Age of Onset   Hashimoto's thyroiditis Mother    Cancer Maternal Aunt 77   Cancer Paternal Aunt 32   Allergies: No Known Allergies Medications: See med rec.  Review of Systems: No headache, visual changes, nausea, vomiting,  diarrhea, constipation, dizziness, abdominal pain, skin rash, fevers, chills, night sweats, swollen lymph nodes, weight loss, chest pain, body aches, joint swelling, muscle aches, shortness of breath, mood changes, visual or auditory hallucinations.  Objective:    BP 129/84   Pulse (!) 106   Temp 97.7 F (36.5 C) (Oral)   Ht 5\' 2"  (1.575 m)   Wt 205 lb 6.4 oz (93.2 kg)   SpO2 100%   BMI 37.57 kg/m   General: Well Developed, well nourished, and in no acute distress. Neuro: Alert and oriented x3, extra-ocular muscles intact, sensation grossly intact. Cranial nerves II through XII are intact, motor, sensory, and coordinative functions are all intact. HEENT: Normocephalic, atraumatic, pupils equal round reactive to light, neck supple, no masses, no lymphadenopathy, thyroid nonpalpable. Oropharynx, nasopharynx, external ear canals are unremarkable. Skin: Warm and dry, no rashes noted. Cardiac: Regular rate and rhythm, no murmurs rubs or gallops. Respiratory: Clear to auscultation bilaterally. Not using accessory muscles, speaking in full sentences. Abdominal: Soft, nontender, nondistended, positive bowel sounds, no masses, no organomegaly. Musculoskeletal: Shoulder, elbow, wrist, hip, knee, ankle stable, and with full range of motion.  Impression and Recommendations:    Wellness examination Routine HCM labs reviewed. HCM reviewed/discussed. Anticipatory guidance regarding healthy weight, lifestyle and choices given. Recommend healthy diet.  Recommend approximately 150 minutes/week of moderate intensity exercise  Recommend regular dental and vision exams Always use seatbelt/lap and shoulder restraints Recommend using smoke alarms and checking batteries at least twice a year Recommend using sunscreen when outside Discussed tetanus immunization recommendations, patient agreed to proceed with this today Recommend seasonal flu vaccine,   Abdominal pain Associated with increased bowel  movement frequency, irregular consistency with stool as well. Has been worsening over the past few months. Not able to specifically identify causal dietary choices - seems to occur no matter what foods she is consuming. No family history of GI issues that patient is aware of Has not had prior evaluation with GI  Return in about 1 year (around 04/25/2023) for CPE.   ___________________________________________ Thorne Wirz de [place], MD, ABFM, [facility]

## 2022-04-24 NOTE — Assessment & Plan Note (Signed)
Associated with increased bowel movement frequency, irregular consistency with stool as well. Has been worsening over the past few months. Not able to specifically identify causal dietary choices - seems to occur no matter what foods she is consuming. No family history of GI issues that patient is aware of Has not had prior evaluation with GI

## 2023-01-20 ENCOUNTER — Encounter: Payer: Self-pay | Admitting: Gastroenterology

## 2023-01-21 LAB — OB RESULTS CONSOLE RPR: RPR: NONREACTIVE

## 2023-01-21 LAB — OB RESULTS CONSOLE HEPATITIS B SURFACE ANTIGEN: Hepatitis B Surface Ag: NEGATIVE

## 2023-01-21 LAB — OB RESULTS CONSOLE ANTIBODY SCREEN: Antibody Screen: NEGATIVE

## 2023-01-21 LAB — OB RESULTS CONSOLE RUBELLA ANTIBODY, IGM: Rubella: IMMUNE

## 2023-01-21 LAB — HEPATITIS C ANTIBODY: HCV Ab: NEGATIVE

## 2023-01-21 LAB — OB RESULTS CONSOLE HIV ANTIBODY (ROUTINE TESTING): HIV: NONREACTIVE

## 2023-02-04 LAB — OB RESULTS CONSOLE GC/CHLAMYDIA
Chlamydia: NEGATIVE
Neisseria Gonorrhea: NEGATIVE

## 2023-04-08 ENCOUNTER — Ambulatory Visit: Payer: BC Managed Care – PPO | Admitting: Physician Assistant

## 2023-04-28 ENCOUNTER — Encounter (HOSPITAL_BASED_OUTPATIENT_CLINIC_OR_DEPARTMENT_OTHER): Payer: BC Managed Care – PPO | Admitting: Family Medicine

## 2023-06-03 ENCOUNTER — Ambulatory Visit: Payer: BC Managed Care – PPO | Attending: Cardiology | Admitting: Cardiology

## 2023-06-03 VITALS — BP 114/78 | HR 110 | Ht 62.0 in | Wt 222.6 lb

## 2023-06-03 DIAGNOSIS — R0789 Other chest pain: Secondary | ICD-10-CM

## 2023-06-03 DIAGNOSIS — Z3A27 27 weeks gestation of pregnancy: Secondary | ICD-10-CM | POA: Diagnosis not present

## 2023-06-03 DIAGNOSIS — R42 Dizziness and giddiness: Secondary | ICD-10-CM

## 2023-06-03 DIAGNOSIS — Z0000 Encounter for general adult medical examination without abnormal findings: Secondary | ICD-10-CM | POA: Diagnosis not present

## 2023-06-03 NOTE — Patient Instructions (Signed)
Medication Instructions:  Your physician recommends that you continue on your current medications as directed. Please refer to the Current Medication list given to you today.  *If you need a refill on your cardiac medications before your next appointment, please call your pharmacy*    Testing/Procedures: Your physician has requested that you have an echocardiogram. Echocardiography is a painless test that uses sound waves to create images of your heart. It provides your doctor with information about the size and shape of your heart and how well your heart's chambers and valves are working. This procedure takes approximately one hour. There are no restrictions for this procedure. Please do NOT wear cologne, perfume, aftershave, or lotions (deodorant is allowed). Please arrive 15 minutes prior to your appointment time.  Please note: We ask at that you not bring children with you during ultrasound (echo/ vascular) testing. Due to room size and safety concerns, children are not allowed in the ultrasound rooms during exams. Our front office staff cannot provide observation of children in our lobby area while testing is being conducted. An adult accompanying a patient to their appointment will only be allowed in the ultrasound room at the discretion of the ultrasound technician under special circumstances. We apologize for any inconvenience.    Follow-Up: At [facility], you and your health needs are our priority.  As part of our continuing mission to provide you with exceptional heart care, we have created designated Provider Care Teams.  These Care Teams include your primary Cardiologist (physician) and Advanced Practice Providers (APPs -  Physician Assistants and Nurse Practitioners) who all work together to provide you with the care you need, when you need it.    Your next appointment:   6 week(s)  Provider:   Thomasene Ripple, DO

## 2023-06-05 ENCOUNTER — Other Ambulatory Visit: Payer: Self-pay | Admitting: Obstetrics and Gynecology

## 2023-06-05 DIAGNOSIS — Z362 Encounter for other antenatal screening follow-up: Secondary | ICD-10-CM

## 2023-06-08 NOTE — Progress Notes (Signed)
Cardio-Obstetrics Clinic  New Evaluation  Date:  06/08/2023   ID:  Kelli Craig, DOB February 07, 1991, MRN [id number]  PCP:  de [place], Raymond J, MD   [facility] Providers Cardiologist:  None  Electrophysiologist:  None       Referring MD: de [place], Raymond J, MD   Chief Complaint: ' I am ok.   History of Present Illness:    Kelli Craig is a 32 y.o. female [G1P0] who is being seen today for the evaluation of chest pain  at the request of de Peru, Buren Kos, MD.   She is  27-week pregnant woman, presents with chest pain that started after the birth of her first child two years ago. The pain is described as pressure and is associated with shortness of breath. The patient initially attributed the pain to muscle strain from carrying her child, but the persistence of the symptoms has raised concerns about her heart health. The patient also reports a history of dizziness that started about ten years ago and has persisted despite extensive workup, including MRIs and a tilt table test. The patient has not experienced any episodes of syncope. The patient also mentions a recent lipid panel done for insurance purposes that showed elevated levels, causing additional anxiety.    Prior CV Studies Reviewed: The following studies were reviewed today:   Past Medical History:  Diagnosis Date   Tachycardia     Past Surgical History:  Procedure Laterality Date   CESAREAN SECTION N/A 02/09/2021   Procedure: CESAREAN SECTION;  Surgeon: Philip Aspen, DO;  Location: [facility] LD [facility]S;  Service: Obstetrics;  Laterality: N/A;      OB History     Gravida  1   Para      Term      Preterm      AB      Living         SAB      IAB      Ectopic      Multiple      Live Births                  Current Medications: Current Meds  Medication Sig   Prenatal Vit-Fe Fumarate-FA (MULTIVITAMIN-PRENATAL) 27-0.8 MG TABS tablet Take 1 tablet by mouth daily at 12 noon.      Allergies:   Patient has no known allergies.   Social History   Socioeconomic History   Marital status: Married    Spouse name: Bill   Number of children: 1   Years of education: Not on file   Highest education level: Not on file  Occupational History   Not on file  Tobacco Use   Smoking status: Former    Current packs/day: 0.00    Types: Cigarettes    Quit date: 07/21/2017    Years since quitting: 5.8   Smokeless tobacco: Never   Tobacco comments:    pt states she only smokes when she drinks alcohol  Substance and Sexual Activity   Alcohol use: Yes    Alcohol/week: 0.0 standard drinks of alcohol    Comment: maybe once a month per pt   Drug use: No   Sexual activity: Yes    Partners: Male    Birth control/protection: None  Other Topics Concern   Not on file  Social History Narrative   Not on file   Social Determinants of Health   Financial Resource Strain: Not on file  Food Insecurity: Not  on file  Transportation Needs: Not on file  Physical Activity: Not on file  Stress: Not on file  Social Connections: Not on file      Family History  Problem Relation Age of Onset   Hashimoto's thyroiditis Mother    Cancer Maternal Aunt 51   Cancer Paternal Aunt 60      ROS:   Please see the history of present illness.    Chest pain and palpitations All other systems reviewed and are negative.   Labs/EKG Reviewed:    EKG:   EKG was ordered today.  The ekg ordered today demonstrates sinus tachycardia.  Recent Labs: No results found for requested labs within last 365 days.   Recent Lipid Panel Lab Results  Component Value Date/Time   CHOL 200 (H) 04/03/2022 08:13 AM   TRIG 114 04/03/2022 08:13 AM   HDL 73 04/03/2022 08:13 AM   CHOLHDL 2.7 04/03/2022 08:13 AM   LDLCALC 107 (H) 04/03/2022 08:13 AM    Physical Exam:    VS:  BP 114/78 (BP Location: Left Arm, Patient Position: Sitting, Cuff Size: Normal)   Pulse (!) 110   Ht 5\' 2"  (1.575 m)   Wt 222 lb 9.6  oz (101 kg)   SpO2 99%   BMI 40.71 kg/m     Wt Readings from Last 3 Encounters:  06/03/23 222 lb 9.6 oz (101 kg)  04/24/22 205 lb 6.4 oz (93.2 kg)  02/25/22 203 lb (92.1 kg)     GEN:  Well nourished, well developed in no acute distress HEENT: Normal NECK: No JVD; No carotid bruits LYMPHATICS: No lymphadenopathy CARDIAC: RRR, no murmurs, rubs, gallops RESPIRATORY:  Clear to auscultation without rales, wheezing or rhonchi  ABDOMEN: Soft, non-tender, non-distended MUSCULOSKELETAL:  No edema; No deformity  SKIN: Warm and dry NEUROLOGIC:  Alert and oriented x 3 PSYCHIATRIC:  Normal affect    Risk Assessment/Risk Calculators:     CARPREG II Risk Prediction Index Score:  1.  The patient's risk for a primary cardiac event is 5%.   Modified World Health Organization [facility]) Classification of Maternal CV Risk   Class I         ASSESSMENT & PLAN:    Chest Pain Chronic chest pain for 2 years, radiating down the arm. No palpitations. Pain not reproduced with palpation or arm movement. Normal EKG. -Order echocardiogram to rule out cardiac etiology.  Dizziness Chronic dizziness for 10 years. No syncope. Previous workup including MRI, cardiology consult, tilt table test, and vestibular rehab were unremarkable. -No further action at this time.  Hypercholesterolemia Recent lipid panel showed elevated levels, however, this was obtained during pregnancy which can falsely elevate levels. Repeat lipid panel 3-6 months postpartum to assess for true hypercholesterolemia.  Follow-up in 6 weeks to review echocardiogram results and assess overall wellbeing.  Patient Instructions  Medication Instructions:  Your physician recommends that you continue on your current medications as directed. Please refer to the Current Medication list given to you today.  *If you need a refill on your cardiac medications before your next appointment, please call your pharmacy*    Testing/Procedures: Your  physician has requested that you have an echocardiogram. Echocardiography is a painless test that uses sound waves to create images of your heart. It provides your doctor with information about the size and shape of your heart and how well your heart's chambers and valves are working. This procedure takes approximately one hour. There are no restrictions for this procedure. Please do NOT  wear cologne, perfume, aftershave, or lotions (deodorant is allowed). Please arrive 15 minutes prior to your appointment time.  Please note: We ask at that you not bring children with you during ultrasound (echo/ vascular) testing. Due to room size and safety concerns, children are not allowed in the ultrasound rooms during exams. Our front office staff cannot provide observation of children in our lobby area while testing is being conducted. An adult accompanying a patient to their appointment will only be allowed in the ultrasound room at the discretion of the ultrasound technician under special circumstances. We apologize for any inconvenience.    Follow-Up: At [facility], you and your health needs are our priority.  As part of our continuing mission to provide you with exceptional heart care, we have created designated Provider Care Teams.  These Care Teams include your primary Cardiologist (physician) and Advanced Practice Providers (APPs -  Physician Assistants and Nurse Practitioners) who all work together to provide you with the care you need, when you need it.    Your next appointment:   6 week(s)  Provider:   Thomasene Ripple, DO     Dispo:  No follow-ups on file.   Medication Adjustments/Labs and Tests Ordered: Current medicines are reviewed at length with the patient today.  Concerns regarding medicines are outlined above.  Tests Ordered: Orders Placed This Encounter  Procedures   EKG 12-Lead   ECHOCARDIOGRAM COMPLETE   Medication Changes: No orders of the defined types were placed in  this encounter.

## 2023-06-09 ENCOUNTER — Other Ambulatory Visit: Payer: Self-pay

## 2023-06-26 DIAGNOSIS — O99210 Obesity complicating pregnancy, unspecified trimester: Secondary | ICD-10-CM | POA: Insufficient documentation

## 2023-06-26 DIAGNOSIS — O34219 Maternal care for unspecified type scar from previous cesarean delivery: Secondary | ICD-10-CM | POA: Insufficient documentation

## 2023-07-03 ENCOUNTER — Ambulatory Visit: Payer: BC Managed Care – PPO | Attending: Obstetrics and Gynecology

## 2023-07-03 ENCOUNTER — Other Ambulatory Visit: Payer: Self-pay

## 2023-07-03 ENCOUNTER — Ambulatory Visit: Payer: BC Managed Care – PPO | Admitting: *Deleted

## 2023-07-03 ENCOUNTER — Encounter: Payer: Self-pay | Admitting: *Deleted

## 2023-07-03 VITALS — BP 115/72 | HR 102

## 2023-07-03 DIAGNOSIS — O34219 Maternal care for unspecified type scar from previous cesarean delivery: Secondary | ICD-10-CM | POA: Diagnosis present

## 2023-07-03 DIAGNOSIS — Z362 Encounter for other antenatal screening follow-up: Secondary | ICD-10-CM

## 2023-07-03 DIAGNOSIS — O99210 Obesity complicating pregnancy, unspecified trimester: Secondary | ICD-10-CM

## 2023-07-09 ENCOUNTER — Ambulatory Visit (HOSPITAL_COMMUNITY): Payer: BC Managed Care – PPO | Attending: Cardiology

## 2023-07-09 DIAGNOSIS — R0789 Other chest pain: Secondary | ICD-10-CM | POA: Insufficient documentation

## 2023-07-09 LAB — ECHOCARDIOGRAM COMPLETE
Area-P 1/2: 5.18 cm2
S' Lateral: 2.4 cm

## 2023-07-31 ENCOUNTER — Ambulatory Visit: Payer: BC Managed Care – PPO | Attending: Cardiology | Admitting: Cardiology

## 2023-07-31 ENCOUNTER — Other Ambulatory Visit (HOSPITAL_COMMUNITY): Payer: Self-pay

## 2023-07-31 ENCOUNTER — Encounter: Payer: Self-pay | Admitting: Cardiology

## 2023-07-31 VITALS — BP 120/78 | HR 112 | Ht 62.0 in | Wt 229.8 lb

## 2023-07-31 DIAGNOSIS — Z3A36 36 weeks gestation of pregnancy: Secondary | ICD-10-CM | POA: Diagnosis not present

## 2023-07-31 DIAGNOSIS — R0789 Other chest pain: Secondary | ICD-10-CM

## 2023-07-31 MED ORDER — BLOOD PRESSURE MONITOR AUTOMAT DEVI
1.0000 [IU] | Freq: Once | 0 refills | Status: AC
  Filled 2023-07-31: qty 1, 1d supply, fill #0

## 2023-07-31 NOTE — Patient Instructions (Addendum)
 Medication Instructions:  Your physician recommends that you continue on your current medications as directed. Please refer to the Current Medication list given to you today.  *If you need a refill on your cardiac medications before your next appointment, please call your pharmacy*  Follow-Up: At [facility], you and your health needs are our priority.  As part of our continuing mission to provide you with exceptional heart care, we have created designated Provider Care Teams.  These Care Teams include your primary Cardiologist (physician) and Advanced Practice Providers (APPs -  Physician Assistants and Nurse Practitioners) who all work together to provide you with the care you need, when you need it.   Your next appointment:   8 week(s) via MyChart  Provider:   Kardie Tobb, DO    Other Instructions:

## 2023-07-31 NOTE — Progress Notes (Signed)
 Cardio-Obstetrics Clinic  New Evaluation  Date:  07/31/2023   ID:  Kelli Craig, DOB 02/23/1991, MRN [id number]  PCP:  de [place], Raymond J, MD   [facility] Providers Cardiologist:  None  Electrophysiologist:  None       Referring MD: de [place], Raymond J, MD   Chief Complaint: ' I am ok.   History of Present Illness:    Kelli Craig is a 33 y.o. female [G2P1001] who is being seen today for the evaluation of chest pain  at the request of de [place], Quintin PARAS, MD.   She is currently 35 weeks and 3 days pregnant and has a toddler, reports feeling better since her last echocardiogram. She states that her mind is more at ease now that her questions have been answered and she knows that her heart is normal. She reports that she has been eating less and feeling better overall. She is scheduled for a C-section at 39 weeks.   Prior CV Studies Reviewed: The following studies were reviewed today: Reviewed echo  Past Medical History:  Diagnosis Date   Abdominal pain 04/24/2022   Anxiety    Encounter to establish care 02/25/2022   Tachycardia    Term pregnancy 02/08/2021   Wellness examination 04/24/2022    Past Surgical History:  Procedure Laterality Date   CESAREAN SECTION N/A 02/09/2021   Procedure: CESAREAN SECTION;  Surgeon: Lilton Legions, DO;  Location: [facility] LD ORS;  Service: Obstetrics;  Laterality: N/A;      OB History     Gravida  2   Para  1   Term  1   Preterm      AB      Living  1      SAB      IAB      Ectopic      Multiple      Live Births  1               Current Medications: Current Meds  Medication Sig   Blood Pressure Monitoring (BLOOD PRESSURE MONITOR AUTOMAT) DEVI 1 Units by Does not apply route once for 1 dose.   Prenatal Vit-Fe Fumarate-FA (MULTIVITAMIN-PRENATAL) 27-0.8 MG TABS tablet Take 1 tablet by mouth daily at 12 noon.     Allergies:   Patient has no known allergies.   Social History   Socioeconomic  History   Marital status: Married    Spouse name: Bill   Number of children: 1   Years of education: Not on file   Highest education level: Not on file  Occupational History   Not on file  Tobacco Use   Smoking status: Former    Current packs/day: 0.00    Types: Cigarettes    Quit date: 07/21/2017    Years since quitting: 6.0   Smokeless tobacco: Never   Tobacco comments:    pt states she only smokes when she drinks alcohol  Vaping Use   Vaping status: Never Used  Substance and Sexual Activity   Alcohol use: Yes    Alcohol/week: 0.0 standard drinks of alcohol    Comment: maybe once a month per pt   Drug use: No   Sexual activity: Yes    Partners: Male    Birth control/protection: None  Other Topics Concern   Not on file  Social History Narrative   Not on file   Social Drivers of Health   Financial Resource Strain: Not on file  Food Insecurity:  Not on file  Transportation Needs: Not on file  Physical Activity: Not on file  Stress: Not on file  Social Connections: Not on file      Family History  Problem Relation Age of Onset   Hashimoto's thyroiditis Mother    Cancer Maternal Aunt 76   Cancer Paternal Aunt 60      ROS:   Please see the history of present illness.    Chest pain and palpitations All other systems reviewed and are negative.   Labs/EKG Reviewed:    EKG:   EKG was ordered today.  The ekg ordered today demonstrates sinus tachycardia.  Recent Labs: No results found for requested labs within last 365 days.   Recent Lipid Panel Lab Results  Component Value Date/Time   CHOL 200 (H) 04/03/2022 08:13 AM   TRIG 114 04/03/2022 08:13 AM   HDL 73 04/03/2022 08:13 AM   CHOLHDL 2.7 04/03/2022 08:13 AM   LDLCALC 107 (H) 04/03/2022 08:13 AM    Physical Exam:    VS:  BP 120/78 (BP Location: Right Arm, Patient Position: Sitting, Cuff Size: Normal)   Pulse (!) 112   Ht 5' 2 (1.575 m)   Wt 229 lb 12.8 oz (104.2 kg)   LMP 11/25/2022   SpO2 98%    BMI 42.03 kg/m     Wt Readings from Last 3 Encounters:  07/31/23 229 lb 12.8 oz (104.2 kg)  06/03/23 222 lb 9.6 oz (101 kg)  04/24/22 205 lb 6.4 oz (93.2 kg)     GEN:  Well nourished, well developed in no acute distress HEENT: Normal NECK: No JVD; No carotid bruits LYMPHATICS: No lymphadenopathy CARDIAC: RRR, no murmurs, rubs, gallops RESPIRATORY:  Clear to auscultation without rales, wheezing or rhonchi  ABDOMEN: Soft, non-tender, non-distended MUSCULOSKELETAL:  No edema; No deformity  SKIN: Warm and dry NEUROLOGIC:  Alert and oriented x 3 PSYCHIATRIC:  Normal affect    Risk Assessment/Risk Calculators:                  ASSESSMENT & PLAN:    Chest Pain - has resolved.   Pregnancy - she is [redacted] weeks pregnant and reports feeling better since the last visit. She has been experiencing indigestion, which she attributes to the pregnancy. She is scheduled for a C-section at 39 weeks. Continue current management plan. Plan for postpartum follow-up in 8 weeks, which will be virtual.  Blood Pressure Monitoring - Patient does not currently have a blood pressure cuff at home, which will be needed for postpartum blood pressure monitoring. Send a prescription for a  home blood pressure cuff (Omron recommended). Check postpartum blood pressure at home.  Patient Instructions  Medication Instructions:  Your physician recommends that you continue on your current medications as directed. Please refer to the Current Medication list given to you today.  *If you need a refill on your cardiac medications before your next appointment, please call your pharmacy*  Follow-Up: At [facility], you and your health needs are our priority.  As part of our continuing mission to provide you with exceptional heart care, we have created designated Provider Care Teams.  These Care Teams include your primary Cardiologist (physician) and Advanced Practice Providers (APPs -  Physician Assistants  and Nurse Practitioners) who all work together to provide you with the care you need, when you need it.   Your next appointment:   8 week(s) via MyChart  Provider:   Luisana Lutzke, DO    Other  Instructions:     Dispo:  No follow-ups on file.   Medication Adjustments/Labs and Tests Ordered: Current medicines are reviewed at length with the patient today.  Concerns regarding medicines are outlined above.  Tests Ordered: No orders of the defined types were placed in this encounter.  Medication Changes: Meds ordered this encounter  Medications   Blood Pressure Monitoring (BLOOD PRESSURE MONITOR AUTOMAT) DEVI    Sig: 1 Units by Does not apply route once for 1 dose.    Dispense:  1 each    Refill:  0

## 2023-08-04 LAB — OB RESULTS CONSOLE GBS: GBS: NEGATIVE

## 2023-08-11 ENCOUNTER — Telehealth (HOSPITAL_COMMUNITY): Payer: Self-pay | Admitting: *Deleted

## 2023-08-11 ENCOUNTER — Encounter (HOSPITAL_COMMUNITY): Payer: Self-pay

## 2023-08-11 NOTE — Telephone Encounter (Signed)
Preadmission screen  

## 2023-08-11 NOTE — Patient Instructions (Signed)
[name]  08/11/2023   Your procedure is scheduled on:  08/25/2023  Arrive at 0530 at Entrance C on [address] at [facility]  and [employer]. You are invited to use the FREE valet parking or use the Visitor's parking deck.  Pick up the phone at the desk and dial [phone].  Call this number if you have problems the morning of surgery: [phone]  Remember:   Do not eat food:(After Midnight) Desps de medianoche.  You may drink clear liquids until arrival at ___0530__.  Clear liquids means a liquid you can see thru.  It can have color such as Cola or Kool aid.  Tea is OK and coffee as long as no milk or creamer of any kind.  Take these medicines the morning of surgery with A SIP OF WATER:  none   Do not wear jewelry, make-up or nail polish.  Do not wear lotions, powders, or perfumes. Do not wear deodorant.  Do not shave 48 hours prior to surgery.  Do not bring valuables to the hospital.  [facility] is not   responsible for any belongings or valuables brought to the hospital.  Contacts, dentures or bridgework may not be worn into surgery.  Leave suitcase in the car. After surgery it may be brought to your room.  For patients admitted to the hospital, checkout time is 11:00 AM the day of              discharge.      Please read over the following fact sheets that you were given:     Preparing for Surgery

## 2023-08-13 ENCOUNTER — Encounter (HOSPITAL_COMMUNITY): Payer: Self-pay

## 2023-08-24 ENCOUNTER — Encounter (HOSPITAL_COMMUNITY)
Admission: RE | Admit: 2023-08-24 | Discharge: 2023-08-24 | Disposition: A | Discharge status: Home / Self Care | Payer: BC Managed Care – PPO | Source: Ambulatory Visit | Attending: Family Medicine | Admitting: Family Medicine

## 2023-08-24 DIAGNOSIS — O34219 Maternal care for unspecified type scar from previous cesarean delivery: Secondary | ICD-10-CM | POA: Insufficient documentation

## 2023-08-24 DIAGNOSIS — Z3A38 38 weeks gestation of pregnancy: Secondary | ICD-10-CM | POA: Insufficient documentation

## 2023-08-24 DIAGNOSIS — Z01810 Encounter for preprocedural cardiovascular examination: Secondary | ICD-10-CM | POA: Insufficient documentation

## 2023-08-24 LAB — CBC
HCT: 38.7 % (ref 36.0–46.0)
Hemoglobin: 12.2 g/dL (ref 12.0–15.0)
MCH: 28 pg (ref 26.0–34.0)
MCHC: 31.5 g/dL (ref 30.0–36.0)
MCV: 89 fL (ref 80.0–100.0)
Platelets: 152 10*3/uL (ref 150–400)
RBC: 4.35 MIL/uL (ref 3.87–5.11)
RDW: 14.5 % (ref 11.5–15.5)
WBC: 8.7 10*3/uL (ref 4.0–10.5)
nRBC: 0 % (ref 0.0–0.2)

## 2023-08-24 LAB — RPR: RPR Ser Ql: NONREACTIVE

## 2023-08-24 LAB — TYPE AND SCREEN
ABO/RH(D): O POS
Antibody Screen: NEGATIVE

## 2023-08-24 NOTE — H&P (Signed)
Preoperative History and Physical  Kelli Craig is a 33 y.o. female presenting for scheduled repeat cesarean section. History of cesarean section for failed induction of labor. Pregnancy complicated by starting BMI 41.   OB History     Gravida  2   Para  1   Term  1   Preterm      AB      Living  1      SAB      IAB      Ectopic      Multiple      Live Births  1          Past Medical History:  Diagnosis Date   Abdominal pain 04/24/2022   Anxiety    Encounter to establish care 02/25/2022   Tachycardia    Term pregnancy 02/08/2021   Wellness examination 04/24/2022   Past Surgical History:  Procedure Laterality Date   CESAREAN SECTION N/A 02/09/2021   Procedure: CESAREAN SECTION;  Surgeon: Philip Aspen, DO;  Location: [facility] LD ORS;  Service: Obstetrics;  Laterality: N/A;   Family History: family history includes Cancer (age of onset: 37) in her maternal aunt; Cancer (age of onset: 59) in her paternal aunt; Hashimoto's thyroiditis in her mother. Social History:  reports that she quit smoking about 6 years ago. Her smoking use included cigarettes. She has never used smokeless tobacco. She reports current alcohol use. She reports that she does not use drugs.     Maternal Diabetes: No Genetic Screening: Normal Maternal Ultrasounds/Referrals: Normal Fetal Ultrasounds or other Referrals:  Referred to Materal Fetal Medicine  Maternal Substance Abuse:  No Significant Maternal Medications:  None Significant Maternal Lab Results:  Group B Strep negative Number of Prenatal Visits:greater than 3 verified prenatal visits Maternal Vaccinations:RSV: Given during pregnancy >/=14 days ago, TDap, and Flu Other Comments:  None     08/13/2023   11:36 AM 07/31/2023   10:31 AM 07/03/2023    7:12 AM  Vitals with BMI  Height 5\' 2"  5\' 2"    Weight 231 lbs 229 lbs 13 oz   BMI 42.24 42.02   Systolic  120 115  Diastolic  78 72  Pulse  112 102   Last menstrual period  11/25/2022, unknown if currently breastfeeding.  Physical exam from recent office visit Constitutional:      Appearance: Normal appearance.  HENT:     Head: Normocephalic.  Eyes:     Pupils: Pupils are equal, round. Cardiovascular:     Rate and Rhythm: Normal rate.    Pulses: Normal pulses.  Abdominal:     General: Abdomen is Gravid, nontender Neurological:     Mental Status: She is alert.   Prenatal labs: ABO, Rh:  O Positive Antibody: Negative (07/03 0000) Rubella: Immune (07/03 0000) RPR: Nonreactive (07/03 0000)  HBsAg: Negative (07/03 0000)  HIV: Non-reactive (07/03 0000)  GBS: Negative/-- (01/14 0000)   Assessment/Plan: 32yo G2P1001 at [redacted]w[redacted]d presenting for scheduled rCS Risks discussed including infection, bleeding, damage to surrounding structures, the need for additional procedures including hysterectomy, and the possibility of uterine rupture with neonatal morbidity/mortality, scarring, and abnormal placentation with subsequent pregnancies. Patient agrees to proceed.  Rh positive GBS neg  Tawni Levy 08/24/2023, 9:57 AM

## 2023-08-25 ENCOUNTER — Other Ambulatory Visit: Payer: Self-pay

## 2023-08-25 ENCOUNTER — Encounter (HOSPITAL_COMMUNITY)
Admission: RE | Disposition: A | Discharge status: Home / Self Care | Payer: Self-pay | Source: Home / Self Care | Attending: Obstetrics and Gynecology

## 2023-08-25 ENCOUNTER — Encounter (HOSPITAL_COMMUNITY): Payer: Self-pay | Admitting: Obstetrics and Gynecology

## 2023-08-25 ENCOUNTER — Inpatient Hospital Stay (HOSPITAL_COMMUNITY): Payer: Self-pay | Admitting: Anesthesiology

## 2023-08-25 ENCOUNTER — Inpatient Hospital Stay (HOSPITAL_COMMUNITY)
Admission: RE | Admit: 2023-08-25 | Discharge: 2023-08-27 | DRG: 788 | Disposition: A | Discharge status: Home / Self Care | Payer: BC Managed Care – PPO | Attending: Obstetrics and Gynecology | Admitting: Obstetrics and Gynecology

## 2023-08-25 DIAGNOSIS — O34219 Maternal care for unspecified type scar from previous cesarean delivery: Principal | ICD-10-CM

## 2023-08-25 DIAGNOSIS — O34211 Maternal care for low transverse scar from previous cesarean delivery: Principal | ICD-10-CM | POA: Diagnosis present

## 2023-08-25 DIAGNOSIS — Z3A39 39 weeks gestation of pregnancy: Secondary | ICD-10-CM

## 2023-08-25 DIAGNOSIS — E66813 Obesity, class 3: Secondary | ICD-10-CM | POA: Diagnosis present

## 2023-08-25 DIAGNOSIS — O99824 Streptococcus B carrier state complicating childbirth: Secondary | ICD-10-CM | POA: Diagnosis present

## 2023-08-25 DIAGNOSIS — O99214 Obesity complicating childbirth: Secondary | ICD-10-CM | POA: Diagnosis present

## 2023-08-25 DIAGNOSIS — Z87891 Personal history of nicotine dependence: Secondary | ICD-10-CM | POA: Diagnosis not present

## 2023-08-25 DIAGNOSIS — Z98891 History of uterine scar from previous surgery: Secondary | ICD-10-CM

## 2023-08-25 SURGERY — Surgical Case
Anesthesia: Spinal

## 2023-08-25 MED ORDER — FENTANYL CITRATE (PF) 100 MCG/2ML IJ SOLN: 12.5000 ug | INTRAMUSCULAR | Status: DC | PRN

## 2023-08-25 MED ORDER — ACETAMINOPHEN 500 MG PO TABS: 1000.0000 mg | ORAL_TABLET | Freq: Four times a day (QID) | ORAL | Status: DC

## 2023-08-25 MED ORDER — SODIUM CHLORIDE 0.9 % IV SOLN: 12.5000 mg | INTRAVENOUS | Status: DC | PRN

## 2023-08-25 MED ORDER — TRANEXAMIC ACID-NACL 1000-0.7 MG/100ML-% IV SOLN
INTRAVENOUS | Status: DC | PRN
  Administered 2023-08-25: 1000 mg via INTRAVENOUS

## 2023-08-25 MED ORDER — DEXMEDETOMIDINE HCL IN NACL 80 MCG/20ML IV SOLN
INTRAVENOUS | Status: DC | PRN
  Administered 2023-08-25: 8 ug via INTRAVENOUS

## 2023-08-25 MED ORDER — SENNOSIDES-DOCUSATE SODIUM 8.6-50 MG PO TABS: 2.0000 | ORAL_TABLET | Freq: Every day | ORAL | Status: DC

## 2023-08-25 MED ORDER — PHENYLEPHRINE 80 MCG/ML (10ML) SYRINGE FOR IV PUSH (FOR BLOOD PRESSURE SUPPORT)
PREFILLED_SYRINGE | INTRAVENOUS | Status: AC
  Filled 2023-08-25: qty 10

## 2023-08-25 MED ORDER — KETOROLAC TROMETHAMINE 30 MG/ML IJ SOLN: 30.0000 mg | Freq: Four times a day (QID) | INTRAMUSCULAR | Status: AC | PRN

## 2023-08-25 MED ORDER — OXYCODONE HCL 5 MG PO TABS
5.0000 mg | ORAL_TABLET | ORAL | Status: DC | PRN
Start: 2023-08-25 — End: 2023-08-27

## 2023-08-25 MED ORDER — FENTANYL CITRATE (PF) 100 MCG/2ML IJ SOLN
25.0000 ug | INTRAMUSCULAR | Status: DC | PRN
Start: 2023-08-25 — End: 2023-08-25

## 2023-08-25 MED ORDER — POVIDONE-IODINE 10 % EX SWAB
2.0000 | Freq: Once | CUTANEOUS | Status: AC
  Administered 2023-08-25: 2 via TOPICAL

## 2023-08-25 MED ORDER — MENTHOL 3 MG MT LOZG: 1.0000 | LOZENGE | OROMUCOSAL | Status: DC | PRN

## 2023-08-25 MED ORDER — HYDROMORPHONE HCL 1 MG/ML IJ SOLN: 0.2500 mg | INTRAMUSCULAR | Status: DC | PRN

## 2023-08-25 MED ORDER — STERILE WATER FOR IRRIGATION IR SOLN
Status: DC | PRN
  Administered 2023-08-25: 1000 mL

## 2023-08-25 MED ORDER — DIPHENHYDRAMINE HCL 25 MG PO CAPS: 25.0000 mg | ORAL_CAPSULE | ORAL | Status: DC | PRN

## 2023-08-25 MED ORDER — SIMETHICONE 80 MG PO CHEW
80.0000 mg | CHEWABLE_TABLET | Freq: Three times a day (TID) | ORAL | Status: DC
  Administered 2023-08-25 – 2023-08-27 (×5): 80 mg via ORAL
  Filled 2023-08-25 (×5): qty 1

## 2023-08-25 MED ORDER — DEXMEDETOMIDINE HCL IN NACL 80 MCG/20ML IV SOLN
INTRAVENOUS | Status: AC
  Filled 2023-08-25: qty 60

## 2023-08-25 MED ORDER — NALOXONE HCL 4 MG/10ML IJ SOLN: 1.0000 ug/kg/h | INTRAVENOUS | Status: DC | PRN

## 2023-08-25 MED ORDER — KETOROLAC TROMETHAMINE 30 MG/ML IJ SOLN
30.0000 mg | Freq: Once | INTRAMUSCULAR | Status: AC
  Administered 2023-08-25: 30 mg via INTRAVENOUS

## 2023-08-25 MED ORDER — ZOLPIDEM TARTRATE 5 MG PO TABS: 5.0000 mg | ORAL_TABLET | Freq: Every evening | ORAL | Status: DC | PRN

## 2023-08-25 MED ORDER — INFLUENZA VIRUS VACC SPLIT PF (FLUZONE) 0.5 ML IM SUSY: 0.5000 mL | PREFILLED_SYRINGE | INTRAMUSCULAR | Status: DC

## 2023-08-25 MED ORDER — DROPERIDOL 2.5 MG/ML IJ SOLN: 0.6250 mg | Freq: Once | INTRAMUSCULAR | Status: DC | PRN

## 2023-08-25 MED ORDER — KETOROLAC TROMETHAMINE 30 MG/ML IJ SOLN: 30.0000 mg | Freq: Once | INTRAMUSCULAR | Status: DC | PRN

## 2023-08-25 MED ORDER — IBUPROFEN 100 MG/5ML PO SUSP
600.0000 mg | Freq: Four times a day (QID) | ORAL | Status: DC
  Administered 2023-08-26 – 2023-08-27 (×4): 600 mg via ORAL
  Filled 2023-08-25 (×4): qty 30

## 2023-08-25 MED ORDER — KETOROLAC TROMETHAMINE 30 MG/ML IJ SOLN
INTRAMUSCULAR | Status: AC
  Filled 2023-08-25: qty 1

## 2023-08-25 MED ORDER — NALOXONE HCL 0.4 MG/ML IJ SOLN: 0.4000 mg | INTRAMUSCULAR | Status: DC | PRN

## 2023-08-25 MED ORDER — DIPHENHYDRAMINE HCL 50 MG/ML IJ SOLN: 12.5000 mg | INTRAMUSCULAR | Status: DC | PRN

## 2023-08-25 MED ORDER — DIPHENHYDRAMINE HCL 25 MG PO CAPS: 25.0000 mg | ORAL_CAPSULE | Freq: Four times a day (QID) | ORAL | Status: DC | PRN

## 2023-08-25 MED ORDER — SENNOSIDES 8.8 MG/5ML PO SYRP
10.0000 mL | ORAL_SOLUTION | Freq: Every day | ORAL | Status: DC
  Administered 2023-08-26: 10 mL via ORAL
  Filled 2023-08-25 (×2): qty 10

## 2023-08-25 MED ORDER — COCONUT OIL OIL: 1.0000 | TOPICAL_OIL | Status: DC | PRN

## 2023-08-25 MED ORDER — FENTANYL CITRATE (PF) 100 MCG/2ML IJ SOLN
INTRAMUSCULAR | Status: AC
  Filled 2023-08-25: qty 2

## 2023-08-25 MED ORDER — OXYCODONE HCL 5 MG/5ML PO SOLN: 5.0000 mg | Freq: Once | ORAL | Status: DC | PRN

## 2023-08-25 MED ORDER — PHENYLEPHRINE HCL-NACL 20-0.9 MG/250ML-% IV SOLN
INTRAVENOUS | Status: DC | PRN
  Administered 2023-08-25: 60 ug/min via INTRAVENOUS

## 2023-08-25 MED ORDER — ONDANSETRON HCL 4 MG/2ML IJ SOLN
INTRAMUSCULAR | Status: DC | PRN
  Administered 2023-08-25: 4 mg via INTRAVENOUS

## 2023-08-25 MED ORDER — CEFAZOLIN SODIUM-DEXTROSE 2-4 GM/100ML-% IV SOLN
INTRAVENOUS | Status: AC
  Filled 2023-08-25: qty 100

## 2023-08-25 MED ORDER — LACTATED RINGERS IV SOLN: INTRAVENOUS | Status: AC

## 2023-08-25 MED ORDER — MORPHINE SULFATE (PF) 0.5 MG/ML IJ SOLN
INTRAMUSCULAR | Status: DC | PRN
  Administered 2023-08-25: 150 ug via INTRATHECAL

## 2023-08-25 MED ORDER — OXYTOCIN-SODIUM CHLORIDE 30-0.9 UT/500ML-% IV SOLN
INTRAVENOUS | Status: AC
  Filled 2023-08-25: qty 1000

## 2023-08-25 MED ORDER — OXYTOCIN-SODIUM CHLORIDE 30-0.9 UT/500ML-% IV SOLN: 2.5000 [IU]/h | INTRAVENOUS | Status: AC

## 2023-08-25 MED ORDER — DEXAMETHASONE SODIUM PHOSPHATE 10 MG/ML IJ SOLN
INTRAMUSCULAR | Status: DC | PRN
  Administered 2023-08-25: 10 mg via INTRAVENOUS

## 2023-08-25 MED ORDER — ONDANSETRON HCL 4 MG/2ML IJ SOLN: 4.0000 mg | Freq: Three times a day (TID) | INTRAMUSCULAR | Status: DC | PRN

## 2023-08-25 MED ORDER — MEPERIDINE HCL 25 MG/ML IJ SOLN: 6.2500 mg | INTRAMUSCULAR | Status: DC | PRN

## 2023-08-25 MED ORDER — KETOROLAC TROMETHAMINE 30 MG/ML IJ SOLN
30.0000 mg | Freq: Four times a day (QID) | INTRAMUSCULAR | Status: AC
  Administered 2023-08-25 – 2023-08-26 (×3): 30 mg via INTRAVENOUS
  Filled 2023-08-25 (×3): qty 1

## 2023-08-25 MED ORDER — SODIUM CHLORIDE 0.9% FLUSH: 3.0000 mL | INTRAVENOUS | Status: DC | PRN

## 2023-08-25 MED ORDER — IBUPROFEN 600 MG PO TABS: 600.0000 mg | ORAL_TABLET | Freq: Four times a day (QID) | ORAL | Status: DC

## 2023-08-25 MED ORDER — OXYCODONE HCL 5 MG PO TABS: 5.0000 mg | ORAL_TABLET | Freq: Once | ORAL | Status: DC | PRN

## 2023-08-25 MED ORDER — CEFAZOLIN SODIUM-DEXTROSE 2-4 GM/100ML-% IV SOLN
2.0000 g | INTRAVENOUS | Status: AC
  Administered 2023-08-25: 2 g via INTRAVENOUS

## 2023-08-25 MED ORDER — MORPHINE SULFATE (PF) 0.5 MG/ML IJ SOLN
INTRAMUSCULAR | Status: AC
  Filled 2023-08-25: qty 10

## 2023-08-25 MED ORDER — ACETAMINOPHEN 160 MG/5ML PO SOLN
1000.0000 mg | Freq: Four times a day (QID) | ORAL | Status: DC
  Administered 2023-08-25 – 2023-08-27 (×8): 1000 mg via ORAL
  Filled 2023-08-25 (×8): qty 40.6

## 2023-08-25 MED ORDER — PRENATAL MULTIVITAMIN CH
1.0000 | ORAL_TABLET | Freq: Every day | ORAL | Status: DC
Start: 2023-08-25 — End: 2023-08-25

## 2023-08-25 MED ORDER — COMPLETENATE 29-1 MG PO CHEW
1.0000 | CHEWABLE_TABLET | Freq: Every day | ORAL | Status: DC
Start: 2023-08-25 — End: 2023-08-27
  Administered 2023-08-26: 1 via ORAL
  Filled 2023-08-25: qty 1

## 2023-08-25 MED ORDER — OXYTOCIN-SODIUM CHLORIDE 30-0.9 UT/500ML-% IV SOLN
INTRAVENOUS | Status: DC | PRN
  Administered 2023-08-25: 300 mL via INTRAVENOUS

## 2023-08-25 MED ORDER — FENTANYL CITRATE (PF) 100 MCG/2ML IJ SOLN
INTRAMUSCULAR | Status: DC | PRN
  Administered 2023-08-25: 15 ug via INTRATHECAL

## 2023-08-25 MED ORDER — DIBUCAINE (PERIANAL) 1 % EX OINT: 1.0000 | TOPICAL_OINTMENT | CUTANEOUS | Status: DC | PRN

## 2023-08-25 MED ORDER — WITCH HAZEL-GLYCERIN EX PADS: 1.0000 | MEDICATED_PAD | CUTANEOUS | Status: DC | PRN

## 2023-08-25 MED ORDER — DEXAMETHASONE SODIUM PHOSPHATE 10 MG/ML IJ SOLN
INTRAMUSCULAR | Status: AC
  Filled 2023-08-25: qty 1

## 2023-08-25 MED ORDER — BUPIVACAINE IN DEXTROSE 0.75-8.25 % IT SOLN
INTRATHECAL | Status: DC | PRN
  Administered 2023-08-25: 1.6 mL via INTRATHECAL

## 2023-08-25 MED ORDER — SIMETHICONE 80 MG PO CHEW: 80.0000 mg | CHEWABLE_TABLET | ORAL | Status: DC | PRN

## 2023-08-25 MED ORDER — ONDANSETRON HCL 4 MG/2ML IJ SOLN
INTRAMUSCULAR | Status: AC
  Filled 2023-08-25: qty 2

## 2023-08-25 SURGICAL SUPPLY — 33 items
BENZOIN TINCTURE PRP APPL 2/3 (GAUZE/BANDAGES/DRESSINGS) ×1 IMPLANT
CHLORAPREP W/TINT 26 (MISCELLANEOUS) ×2 IMPLANT
CLAMP UMBILICAL CORD (MISCELLANEOUS) ×1 IMPLANT
CLOTH BEACON ORANGE TIMEOUT ST (SAFETY) ×1 IMPLANT
DERMABOND ADVANCED .7 DNX12 (GAUZE/BANDAGES/DRESSINGS) IMPLANT
DRSG OPSITE POSTOP 4X10 (GAUZE/BANDAGES/DRESSINGS) ×1 IMPLANT
ELECT REM PT RETURN 9FT ADLT (ELECTROSURGICAL) ×1
ELECTRODE REM PT RTRN 9FT ADLT (ELECTROSURGICAL) ×1 IMPLANT
EXTRACTOR VACUUM KIWI (MISCELLANEOUS) IMPLANT
GAUZE PAD ABD 7.5X8 STRL (GAUZE/BANDAGES/DRESSINGS) IMPLANT
GAUZE SPONGE 4X4 12PLY STRL LF (GAUZE/BANDAGES/DRESSINGS) IMPLANT
GLOVE BIOGEL PI IND STRL 6 (GLOVE) ×1 IMPLANT
GLOVE SS PI 5.5 STRL (GLOVE) ×1 IMPLANT
GOWN STRL REUS W/TWL LRG LVL3 (GOWN DISPOSABLE) ×2 IMPLANT
KIT ABG SYR 3ML LUER SLIP (SYRINGE) ×1 IMPLANT
MAT PREVALON FULL STRYKER (MISCELLANEOUS) IMPLANT
NDL HYPO 25X5/8 SAFETYGLIDE (NEEDLE) ×1 IMPLANT
NEEDLE HYPO 25X5/8 SAFETYGLIDE (NEEDLE) ×1 IMPLANT
NS IRRIG 1000ML POUR BTL (IV SOLUTION) ×1 IMPLANT
PACK C SECTION WH (CUSTOM PROCEDURE TRAY) ×1 IMPLANT
PAD OB MATERNITY 4.3X12.25 (PERSONAL CARE ITEMS) ×1 IMPLANT
RETRACTOR TRAXI PANNICULUS (MISCELLANEOUS) IMPLANT
RTRCTR C-SECT PINK 25CM LRG (MISCELLANEOUS) ×1 IMPLANT
STRIP CLOSURE SKIN 1/2X4 (GAUZE/BANDAGES/DRESSINGS) IMPLANT
SUT CHROMIC 2 0 CT 1 (SUTURE) ×1 IMPLANT
SUT MON AB 2-0 CT1 27 (SUTURE) ×1 IMPLANT
SUT MON AB-0 CT1 36 (SUTURE) ×1 IMPLANT
SUT PDS AB 0 CT1 27 (SUTURE) ×1 IMPLANT
SUT VIC AB 0 CTX36XBRD ANBCTRL (SUTURE) ×2 IMPLANT
SUT VIC AB 4-0 PS2 27 (SUTURE) ×1 IMPLANT
TOWEL OR 17X24 6PK STRL BLUE (TOWEL DISPOSABLE) ×1 IMPLANT
TRAY FOLEY W/BAG SLVR 14FR LF (SET/KITS/TRAYS/PACK) IMPLANT
WATER STERILE IRR 1000ML POUR (IV SOLUTION) ×1 IMPLANT

## 2023-08-25 NOTE — Transfer of Care (Signed)
 Immediate Anesthesia Transfer of Care Note  Patient: Kelli Craig  Procedure(s) Performed: REPEAT CESAREAN SECTION EDC: 09/01/23 ALLERG: NKDA PREVIOUS X 1  Patient Location: PACU  Anesthesia Type:Spinal  Level of Consciousness: awake, alert , and oriented  Airway & Oxygen Therapy: Patient Spontanous Breathing  Post-op Assessment: Report given to RN and Post -op Vital signs reviewed and stable  Post vital signs: Reviewed and stable  Last Vitals:  Vitals Value Taken Time  BP 104/67 08/25/23 0851  Temp    Pulse 85 08/25/23 0853  Resp 16 08/25/23 0853  SpO2 99 % 08/25/23 0853  Vitals shown include unfiled device data.  Last Pain:  Vitals:   08/25/23 0605  TempSrc: Oral         Complications: No notable events documented.

## 2023-08-25 NOTE — Anesthesia Postprocedure Evaluation (Signed)
 Anesthesia Post Note  Patient: Kelli Craig  Procedure(s) Performed: REPEAT CESAREAN SECTION EDC: 09/01/23 ALLERG: NKDA PREVIOUS X 1     Patient location during evaluation: PACU Anesthesia Type: Spinal Level of consciousness: awake and alert Pain management: pain level controlled Vital Signs Assessment: post-procedure vital signs reviewed and stable Respiratory status: spontaneous breathing, nonlabored ventilation and respiratory function stable Cardiovascular status: blood pressure returned to baseline and stable Postop Assessment: no apparent nausea or vomiting Anesthetic complications: no   No notable events documented.  Last Vitals:  Vitals:   08/25/23 0945 08/25/23 1020  BP: (!) 108/48 95/76  Pulse: 75 71  Resp: (!) 22 18  Temp:  36.4 C  SpO2: 96% 97%    Last Pain:  Vitals:   08/25/23 1020  TempSrc: Axillary  PainSc:    Pain Goal:                Epidural/Spinal Function Cutaneous sensation: Tingles (08/25/23 1020), Patient able to flex knees: No (08/25/23 1020), Patient able to lift hips off bed: No (08/25/23 1020), Back pain beyond tenderness at insertion site: No (08/25/23 1020), Progressively worsening motor and/or sensory loss: No (08/25/23 1020), Bowel and/or bladder incontinence post epidural: No (08/25/23 1020)  Butler Levander Pinal

## 2023-08-25 NOTE — Anesthesia Preprocedure Evaluation (Addendum)
Anesthesia Evaluation  Patient identified by MRN, date of birth, ID band Patient awake    Reviewed: Allergy & Precautions, H&P , NPO status , Patient's Chart, lab work & pertinent test results  History of Anesthesia Complications Negative for: history of anesthetic complications  Airway Mallampati: II  TM Distance: >3 FB Neck ROM: Full    Dental no notable dental hx. (+) Dental Advisory Given   Pulmonary neg pulmonary ROS, former smoker   Pulmonary exam normal breath sounds clear to auscultation       Cardiovascular negative cardio ROS Normal cardiovascular exam Rhythm:Regular Rate:Normal     Neuro/Psych   Anxiety     negative neurological ROS  negative psych ROS   GI/Hepatic negative GI ROS, Neg liver ROS,,,  Endo/Other  negative endocrine ROS  Class 3 obesityBMI 44  Renal/GU negative Renal ROS  negative genitourinary   Musculoskeletal negative musculoskeletal ROS (+)    Abdominal Normal abdominal exam  (+) + obese  Peds negative pediatric ROS (+)  Hematology negative hematology ROS (+) hc 36.1, plt 188   Anesthesia Other Findings Day of surgery medications reviewed with patient.  Reproductive/Obstetrics negative OB ROS (+) Pregnancy (Hx of C/S x1)                             Anesthesia Physical Anesthesia Plan  ASA: 3  Anesthesia Plan: Spinal   Post-op Pain Management:    Induction:   PONV Risk Score and Plan: 2 and Treatment may vary due to age or medical condition and Ondansetron  Airway Management Planned: Natural Airway  Additional Equipment: None  Intra-op Plan:   Post-operative Plan:   Informed Consent: I have reviewed the patients History and Physical, chart, labs and discussed the procedure including the risks, benefits and alternatives for the proposed anesthesia with the patient or authorized representative who has indicated his/her understanding and  acceptance.     Dental advisory given  Plan Discussed with: Anesthesiologist and CRNA  Anesthesia Plan Comments: (Will plan to dose epidural for surgical anesthesia. GETA vs. Spinal as backup plan. )        Anesthesia Quick Evaluation

## 2023-08-25 NOTE — Lactation Note (Signed)
 This note was copied from a baby's chart. Lactation Consultation Note  Patient Name: Kelli Craig Date: 08/25/2023 Age:33 years Reason for consult: Initial assessment;Term (hx of extreme over supply)  P2- MOB plans to focus on latching infant while in the hospital, but states that she may switch to exclusive pumping once home because it worked very well for her family with the last child. MOB reports that infant has been latching very well since birth. MOB was a little concerned because infant now seems a little sleepy and has been spitting up a lot of fluid. LC reviewed the first 24 hr birthday nap and why infant has fluid in the belly (c-section baby). RN had provided MOB with a manual pump per MOB request. LC provided MOB with a 21 mm flange. MOB reports that she had an extreme over supply with her first child and she would like to have one again. MOB states that her ideal goal would be to pump so much milk in the first 6 months of life, and then stop pumping and just use up what has been collected. Per MOB, she had so much milk with her last child that she was able to donate over 2,000 ounces to a mom in need and still had to recently throw away a bunch of her old milk.   LC praised MOB for her supply, but still warned her about the possible complications that may come with creating an over supply. MOB verbalizes understanding and states that she will take precautions. MOB denies having any questions or concerns at this time. MOB plans to call [facility] team tonight or tomorrow for a latch assessment. LC reviewed feeding infant on cue 8-12x in 24 hrs, not allowing infant to go over 3 hrs without a feeding, CDC milk storage guidelines and LC services handout. LC encouraged MOB to call for further assistance as needed.  Maternal Data Has patient been taught Hand Expression?: Yes Does the patient have breastfeeding experience prior to this delivery?: Yes How long did the patient breastfeed?: 1 year  of exclusive pumping with an extreme over supply  Feeding Mother's Current Feeding Choice: Breast Milk  Lactation Tools Discussed/Used Tools: Pump;Flanges Flange Size: 21 (per MOB, LC did not size at this time due to vistors being present) Breast pump type: Manual Pump Education: Setup, frequency, and cleaning;Milk Storage Reason for Pumping: MOB request (wants to create an over supply like she had with first child) Pumping frequency: 15-20 min every 3 hrs as needed  Interventions Interventions: Breast feeding basics reviewed;Hand pump;Education;LC Services brochure  Discharge Discharge Education: Engorgement and breast care;Warning signs for feeding baby Pump: DEBP;Manual;Hands Free;Personal  Consult Status Consult Status: Follow-up Date: 08/26/23 Follow-up type: In-patient    Recardo Hoit BS, IBCLC 08/25/2023, 5:28 PM

## 2023-08-25 NOTE — Op Note (Signed)
 PROCEDURE DATE: 08/25/2023   PREOPERATIVE DIAGNOSIS: History of cesarean section for failed IOL, BMI 43   POSTOPERATIVE DIAGNOSIS: The same   PROCEDURE:  Repeat Low Transverse Cesarean Section   SURGEON:  Dr. Slater Door  ASSISTANT: Dr. Lynwood Clubs   INDICATIONS: This is a 32yo G2P1001 at 41 wga requiring cesarean section secondary to history of cesarean section x1.   Decision made to proceed with LTCS. The risks of cesarean section discussed with the patient included but were not limited to: bleeding which may require transfusion or reoperation; infection which may require antibiotics; injury to bowel, bladder, ureters or other surrounding organs; injury to the fetus; need for additional procedures including hysterectomy in the event of a life-threatening hemorrhage; placental abnormalities wth subsequent pregnancies, incisional problems, thromboembolic phenomenon and other postoperative/anesthesia complications. The patient agreed with the proposed plan, giving informed consent for the procedure.     FINDINGS:  Viable female infant Elsie in vertex presentation, APGARs 8+9,  Weight 3860g, Amniotic fluid clear, Intact placenta, three vessel cord. Grossly normal uterus - thin lower uterine segment. No intraabdominal adhesions noted. .   ANESTHESIA:    Epidural ESTIMATED BLOOD LOSS: 202ccs SPECIMENS: Placenta for routine COMPLICATIONS: None immediate   PROCEDURE IN DETAIL:  The patient received intravenous antibiotics (2g Ancef ) and had sequential compression devices applied to her lower extremities while in the preoperative area.  She was then taken to the operating room where spinal anesthesia was obtained. She was then placed in a dorsal supine position with a leftward tilt, and prepped and draped in a sterile manner.  A foley catheter was placed into her bladder and attached to constant gravity.  After an adequate timeout was performed, a Pfannenstiel skin incision was made with scalpel and  carried through to the underlying layer of fascia. The fascia was incised in the midline and this incision was extended bilaterally with curved Mayos. Kocher clamps were applied to the superior aspect of the fascial incision and the underlying rectus muscles were dissected off bluntly and with Mayos. A similar process was carried out on the inferior aspect of the facial incision. The rectus muscles were separated in the midline bluntly and the peritoneum was entered bluntly.  The peritoneum was extended bilaterally and an Alexis retractor was placed for better visualization. A bladder flap was created sharply and developed bluntly. A transverse hysterotomy was made with a scalpel and extended bilaterally bluntly. The infant was successfully delivered, and cord was clamped and cut and infant was handed over to awaiting neonatology team. Cord blood and arterial blood gas was collected. The placenta was delivered intact with three-vessel cord. The uterus was cleared of clot and debris. The hysterotomy was closed with 0 vicryl in a single, unlocked layer. A figure of eight was placed with 0-monocryl to reinforce the incision and aid in hemostasis. Excellent hemostasis was again noted. The Alexis retractor was removed. The fascia was closed with looped PDS in a running fashion with good restoration of anatomy.  The subcutaneus tissue was irrigated and was reapproximated using 2-0 monocryl.  The skin was closed with 4-0 Vicryl in a subcuticular fashion.  All surgical sites examined and hemostatic at end of procedure.   Pt tolerated the procedure well. All sponge/lap/needle counts were correct  X 2. Pt taken to recovery room in stable condition.    Slater Door, MD

## 2023-08-25 NOTE — Progress Notes (Signed)
Pt request oral meds be liquid or chewable form due to difficulty swallowing pills.  Notified main pharmacy.

## 2023-08-25 NOTE — Anesthesia Procedure Notes (Signed)
 Spinal  Patient location during procedure: OB Start time: 08/25/2023 7:30 AM End time: 08/25/2023 7:35 AM Reason for block: surgical anesthesia Staffing Performed: anesthesiologist  Anesthesiologist: Cleotilde Butler Dade, MD Performed by: Cleotilde Butler Dade, MD Authorized by: Cleotilde Butler Dade, MD   Preanesthetic Checklist Completed: patient identified, IV checked, risks and benefits discussed, surgical consent, monitors and equipment checked, pre-op evaluation and timeout performed Spinal Block Patient position: sitting Prep: DuraPrep and site prepped and draped Patient monitoring: heart rate, cardiac monitor, continuous pulse ox and blood pressure Approach: midline Location: L3-4 Injection technique: single-shot Needle Needle type: Pencan  Needle gauge: 24 G Needle length: 10 cm Assessment Sensory level: T4 Events: CSF return

## 2023-08-25 NOTE — Interval H&P Note (Signed)
 History and Physical Interval Note:  08/25/2023 7:20 AM  Kelli Craig Bucks  has presented today for surgery, with the diagnosis of PREVIOUS X 1.  The various methods of treatment have been discussed with the patient and family. After consideration of risks, benefits and other options for treatment, the patient has consented to  Procedure(s): REPEAT CESAREAN SECTION EDC: 09/01/23 ALLERG: NKDA PREVIOUS X 1 (N/A) as a surgical intervention.  The patient's history has been reviewed, patient examined, no change in status, stable for surgery.  I have reviewed the patient's chart and labs.  Questions were answered to the patient's satisfaction.     Slater JINNY Door

## 2023-08-26 ENCOUNTER — Encounter (HOSPITAL_COMMUNITY): Payer: Self-pay | Admitting: Obstetrics and Gynecology

## 2023-08-26 ENCOUNTER — Other Ambulatory Visit: Payer: Self-pay

## 2023-08-26 LAB — CBC
HCT: 30.7 % — ABNORMAL LOW (ref 36.0–46.0)
Hemoglobin: 9.8 g/dL — ABNORMAL LOW (ref 12.0–15.0)
MCH: 28.2 pg (ref 26.0–34.0)
MCHC: 31.9 g/dL (ref 30.0–36.0)
MCV: 88.2 fL (ref 80.0–100.0)
Platelets: 143 10*3/uL — ABNORMAL LOW (ref 150–400)
RBC: 3.48 MIL/uL — ABNORMAL LOW (ref 3.87–5.11)
RDW: 14.5 % (ref 11.5–15.5)
WBC: 12.1 10*3/uL — ABNORMAL HIGH (ref 4.0–10.5)
nRBC: 0 % (ref 0.0–0.2)

## 2023-08-26 LAB — BIRTH TISSUE RECOVERY COLLECTION (PLACENTA DONATION)

## 2023-08-26 NOTE — Progress Notes (Signed)
 Subjective: Postpartum Day 1: Cesarean Delivery Patient reports doing well Objective: Vital signs in last 24 hours: Temp:  [97.6 F (36.4 C)-98.6 F (37 C)] 98.5 F (36.9 C) (02/05 0305) Pulse Rate:  [65-82] 71 (02/05 0305) Resp:  [17-18] 18 (02/05 0305) BP: (95-116)/(57-78) 116/77 (02/05 0305) SpO2:  [97 %-100 %] 100 % (02/04 1759)  Physical Exam:  General: alert, cooperative, appears stated age, and no distress Lochia: appropriate Uterine Fundus: firm Incision: bandage dry DVT Evaluation: No evidence of DVT seen on physical exam.  Recent Labs    08/24/23 0904 08/26/23 0553  HGB 12.2 9.8*  HCT 38.7 30.7*    Assessment/Plan: Status post Cesarean section. Doing well postoperatively.  Continue current care. Wants circ today and d/c tomorrow  Alm JAYSON Cook, MD 08/26/2023, 10:02 AM

## 2023-08-26 NOTE — Progress Notes (Signed)
 MOB was referred for history of depression/anxiety.  * Referral screened out by Clinical Social Worker because none of the following criteria appear to apply:  ~ History of anxiety/depression during this pregnancy, or of post-partum depression following prior delivery.  ~ Diagnosis of anxiety and/or depression within last 3 years  Per OB notes, MOB did not indicate any signs/symptoms during her pregnancy  OR  * MOB's symptoms currently being treated with medication and/or therapy.   Please contact the Clinical Social Worker if needs arise, by [facility] request, or if MOB scores greater than 9/yes to question 10 on Edinburgh Postpartum Depression Screen.  Rosina Molt, [place] Clinical Social Worker [phone]

## 2023-08-26 NOTE — Lactation Note (Signed)
 This note was copied from a baby's chart. Lactation Consultation Note  Patient Name: Kelli Craig Unijb'd Date: 08/26/2023 Age:33 hours Reason for consult: Term  P2, 39 wks, @ 26 hrs of age. Mom shares concerns overnight of baby being more irritable @ breast. This is why mom supplemented overnight. Reassured baby more alert day 2, with irritability and cluster feeding overnight. Discussed normal weight loss of 7-10% on DC day. Encouraged mom to start with hand expression to start, compression through feeding (more milk/ more fat) to keep infant working at breast, and today offering both breasts in a feeding to combat against initial weight loss. Start or end feedings with milk expressed onto spoon, feed off spoon to calm infant @ breast. Encouraged mom with new baby we're more hands on- one hand breast, one hand baby. Demonstrated getting big mouth latch with baby and moving deeply onto breast. Per mom - this baby much better @ latching then her first. Discussed baby will be tired after circumcision this afternoon, then cluster feeding overnight again. Encouraged EBM or coconut oil after feeds for nipple care. Highlighted hand pump and motrin  great way to soften inflamed breast if over full.  Maternal Data Has patient been taught Hand Expression?: Yes Does the patient have breastfeeding experience prior to this delivery?: Yes  Feeding Mother's Current Feeding Choice: Breast Milk and Formula   Interventions Interventions: Breast feeding basics reviewed;Hand express;Breast compression;Expressed milk;Hand pump;Education  Discharge Pump: DEBP;Manual;Personal (Per mom has multiple pumps @ home, hand pump provided to her by previous shift.)  Consult Status Consult Status: Follow-up Date: 08/27/23 Follow-up type: In-patient    [facility] 08/26/2023, 10:22 AM

## 2023-08-27 MED ORDER — IBUPROFEN 600 MG PO TABS: 600.0000 mg | ORAL_TABLET | Freq: Four times a day (QID) | ORAL | 1 refills | Status: AC | PRN

## 2023-08-27 MED ORDER — SENNOSIDES-DOCUSATE SODIUM 8.6-50 MG PO TABS: 1.0000 | ORAL_TABLET | Freq: Every day | ORAL | 1 refills | Status: AC

## 2023-08-27 MED ORDER — ACETAMINOPHEN 500 MG PO TABS: 500.0000 mg | ORAL_TABLET | Freq: Four times a day (QID) | ORAL | 0 refills | Status: AC | PRN

## 2023-08-27 MED ORDER — OXYCODONE HCL 5 MG/5ML PO SOLN
5.0000 mg | ORAL | Status: DC | PRN
  Administered 2023-08-27: 5 mg via ORAL
  Filled 2023-08-27: qty 5

## 2023-08-27 MED ORDER — OXYCODONE HCL 5 MG PO TABS: 5.0000 mg | ORAL_TABLET | ORAL | 0 refills | Status: AC | PRN

## 2023-08-27 NOTE — Lactation Note (Signed)
 This note was copied from a baby's chart. Lactation Consultation Note  Patient Name: Kelli Craig Unijb'd Date: 08/27/2023 Age:33 hours Reason for consult: Term  Mom anticipated DC today. Discussed cluster feeding overnight/ early morning brings in our milk supply, shared expectations of milk coming in. Highlighted risk of engorgement. Discussed hand pump/express to soften breasts, motrin  as anti-inflammatory, and ice packs for 10-20 minutes post feed/pumping if still over-full is the best treatments for inflamed/engorged breasts.  Maternal Data Has patient been taught Hand Expression?: Yes Does the patient have breastfeeding experience prior to this delivery?: Yes  Feeding Mother's Current Feeding Choice: Breast Milk and Formula Nipple Type: Slow - flow   Interventions Interventions: Education  Discharge Discharge Education: Engorgement and breast care Pump: Personal  Consult Status Consult Status: Complete Date: 08/27/23    [facility]  Kelli Craig 08/27/2023, 11:13 AM

## 2023-08-27 NOTE — Discharge Summary (Signed)
 Postpartum Discharge Summary  Date of Service updated 08/27/2023     Patient Name: Kelli Craig DOB: [birth year] MRN: [id number]  Date of admission: 08/25/2023 Delivery date:08/25/2023 Delivering provider: LAURENCE SLATER PARAS Date of discharge: 08/27/2023  Admitting diagnosis: S/P repeat low transverse C-section [Z98.891] Intrauterine pregnancy: [redacted]w[redacted]d     Secondary diagnosis:  Principal Problem:   S/P repeat low transverse C-section  Additional problems: none    Discharge diagnosis: Term Pregnancy Delivered and Anemia (acute blood loss, not clinically significant)                                              Post partum procedures: none Augmentation: N/A Complications: None  Hospital course: Scheduled C/S   33 y.o. yo G2P2002 at [redacted]w[redacted]d was admitted to the hospital 08/25/2023 for scheduled cesarean section with the following indication:Elective Repeat.Delivery details are as follows:  Membrane Rupture Time/Date: 7:59 AM,08/25/2023  Delivery Method:C-Section, Low Transverse Operative Delivery:N/A Details of operation can be found in separate operative note.  Patient had a postpartum course complicated by none.  She is ambulating, tolerating a regular diet, passing flatus, and urinating well. Patient is discharged home in stable condition on  08/27/23        Newborn Data: Birth date:08/25/2023 Birth time:8:00 AM Gender:Female Living status:Living Apgars:8 ,9  Weight:3860 g    Magnesium Sulfate received: No BMZ received: No Rhophylac:N/A MMR:N/A T-DaP:Given prenatally Transfusion:No Immunizations administered: Immunization History  Administered Date(s) Administered   Influenza,inj,Quad PF,6+ Mos 04/24/2022   Influenza-Unspecified 04/20/2018, 04/21/2019, 06/08/2019, 04/20/2020   Moderna Sars-Covid-2 Vaccination 08/15/2019   Tdap 11/08/2020    Physical exam  Vitals:   08/26/23 0305 08/26/23 1423 08/26/23 2229 08/27/23 0539  BP: 116/77 (!) 100/58 109/74 109/73  Pulse: 71 78 74 69   Resp: 18 16 16 16   Temp: 98.5 F (36.9 C) 98 F (36.7 C) 98.3 F (36.8 C) 98 F (36.7 C)  TempSrc:  Oral Oral Oral  SpO2:  98% 95%   Weight:      Height:       General: alert, cooperative, and no distress Lochia: appropriate Uterine Fundus: firm Incision: Healing well with no significant drainage DVT Evaluation: No evidence of DVT seen on physical exam. Labs: Lab Results  Component Value Date   WBC 12.1 (H) 08/26/2023   HGB 9.8 (L) 08/26/2023   HCT 30.7 (L) 08/26/2023   MCV 88.2 08/26/2023   PLT 143 (L) 08/26/2023      Latest Ref Rng & Units 04/03/2022    8:13 AM  CMP  Glucose 70 - 99 mg/dL 98   BUN 6 - 20 mg/dL 10   Creatinine 9.42 - 1.00 mg/dL 9.18   Sodium 865 - 855 mmol/L 142   Potassium 3.5 - 5.2 mmol/L 5.1   Chloride 96 - 106 mmol/L 105   CO2 20 - 29 mmol/L 19   Calcium 8.7 - 10.2 mg/dL 9.7   Total Protein 6.0 - 8.5 g/dL 7.4   Total Bilirubin 0.0 - 1.2 mg/dL 0.3   Alkaline Phos 44 - 121 IU/L 133   AST 0 - 40 IU/L 13   ALT 0 - 32 IU/L 12    Edinburgh Score:    08/25/2023    1:45 PM  Edinburgh Postnatal Depression Scale Screening Tool  I have been able to laugh and see the funny side of  things. 0  I have looked forward with enjoyment to things. 0  I have blamed myself unnecessarily when things went wrong. 0  I have been anxious or worried for no good reason. 1  I have felt scared or panicky for no good reason. 0  Things have been getting on top of me. 0  I have been so unhappy that I have had difficulty sleeping. 0  I have felt sad or miserable. 0  I have been so unhappy that I have been crying. 0  The thought of harming myself has occurred to me. 0  Edinburgh Postnatal Depression Scale Total 1      After visit meds:  Allergies as of 08/27/2023   No Known Allergies      Medication List     TAKE these medications    acetaminophen  500 MG tablet Commonly known as: TYLENOL  Take 1 tablet (500 mg total) by mouth every 6 (six) hours as needed.    ibuprofen  600 MG tablet Commonly known as: ADVIL  Take 1 tablet (600 mg total) by mouth every 6 (six) hours as needed.   oxyCODONE  5 MG immediate release tablet Commonly known as: Roxicodone  Take 1 tablet (5 mg total) by mouth every 4 (four) hours as needed for severe pain (pain score 7-10).   senna-docusate 8.6-50 MG tablet Commonly known as: Senokot-S Take 1 tablet by mouth daily.         Discharge home in stable condition Infant Feeding: Bottle and Breast Infant Disposition:home with mother Discharge instruction: per After Visit Summary and Postpartum booklet. Activity: Advance as tolerated. Pelvic rest for 6 weeks.  Diet: routine diet Anticipated Birth Control: planning vasectomy Postpartum Appointment:6 weeks Additional Postpartum F/U:  none Future Appointments: Future Appointments  Date Time Provider Department Center  09/22/2023  2:00 PM Tobb, Kardie, DO [facility] None    08/27/2023 Slater JINNY Door, MD

## 2023-09-02 ENCOUNTER — Telehealth (HOSPITAL_COMMUNITY): Payer: Self-pay | Admitting: *Deleted

## 2023-09-02 NOTE — Telephone Encounter (Signed)
Attempted hospital discharge follow-up call. Left message for patient to return RN call with any questions or concerns. Deforest Hoyles, RN, 09/02/23, [phone]

## 2023-09-21 ENCOUNTER — Telehealth: Payer: Self-pay | Admitting: Cardiology

## 2023-09-21 NOTE — Telephone Encounter (Signed)
 Patient identification verified by 2 forms. Shade Flood, RN     Cancellation acknowledged

## 2023-09-21 NOTE — Telephone Encounter (Signed)
 Patient canceled video visit and stated she will call back to reschedule.

## 2023-09-22 ENCOUNTER — Ambulatory Visit: Payer: BC Managed Care – PPO | Admitting: Cardiology
